# Patient Record
Sex: Male | Born: 1935 | Race: White | Hispanic: No | Smoking: Former smoker
Health system: Southern US, Community
[De-identification: ages and names within clinical notes are randomized; demographics above are authoritative.]

## PROBLEM LIST (undated history)

## (undated) DIAGNOSIS — M1990 Unspecified osteoarthritis, unspecified site: Secondary | ICD-10-CM

## (undated) DIAGNOSIS — E079 Disorder of thyroid, unspecified: Secondary | ICD-10-CM

## (undated) DIAGNOSIS — H3530 Unspecified macular degeneration: Secondary | ICD-10-CM

## (undated) DIAGNOSIS — I10 Essential (primary) hypertension: Secondary | ICD-10-CM

## (undated) DIAGNOSIS — J45909 Unspecified asthma, uncomplicated: Secondary | ICD-10-CM

## (undated) DIAGNOSIS — E7800 Pure hypercholesterolemia, unspecified: Secondary | ICD-10-CM

## (undated) DIAGNOSIS — H9190 Unspecified hearing loss, unspecified ear: Secondary | ICD-10-CM

## (undated) DIAGNOSIS — J302 Other seasonal allergic rhinitis: Secondary | ICD-10-CM

## (undated) HISTORY — DX: Unspecified osteoarthritis, unspecified site: M19.90

## (undated) HISTORY — DX: Unspecified asthma, uncomplicated: J45.909

## (undated) HISTORY — DX: Pure hypercholesterolemia, unspecified: E78.00

## (undated) HISTORY — DX: Unspecified hearing loss, unspecified ear: H91.90

## (undated) HISTORY — PX: PACEMAKER PLACEMENT: SHX43

## (undated) HISTORY — DX: Unspecified macular degeneration: H35.30

## (undated) HISTORY — DX: Disorder of thyroid, unspecified: E07.9

## (undated) HISTORY — DX: Other seasonal allergic rhinitis: J30.2

## (undated) HISTORY — DX: Essential (primary) hypertension: I10

---

## 2004-11-02 ENCOUNTER — Encounter: Payer: Self-pay | Admitting: Otolaryngology

## 2004-11-06 ENCOUNTER — Ambulatory Visit: Payer: Self-pay | Admitting: Otolaryngology

## 2005-03-04 ENCOUNTER — Emergency Department: Payer: Self-pay | Admitting: Internal Medicine

## 2005-03-04 ENCOUNTER — Other Ambulatory Visit: Payer: Self-pay

## 2006-06-13 ENCOUNTER — Ambulatory Visit: Payer: Self-pay | Admitting: Internal Medicine

## 2007-01-30 ENCOUNTER — Ambulatory Visit: Payer: Self-pay | Admitting: Otolaryngology

## 2008-10-22 ENCOUNTER — Ambulatory Visit: Payer: Self-pay | Admitting: Internal Medicine

## 2008-10-23 ENCOUNTER — Ambulatory Visit: Payer: Self-pay | Admitting: Internal Medicine

## 2009-03-19 ENCOUNTER — Inpatient Hospital Stay: Payer: Self-pay | Admitting: Internal Medicine

## 2009-07-07 ENCOUNTER — Emergency Department: Payer: Self-pay | Admitting: Emergency Medicine

## 2011-06-08 ENCOUNTER — Inpatient Hospital Stay: Payer: Self-pay | Admitting: Internal Medicine

## 2011-11-22 ENCOUNTER — Ambulatory Visit: Payer: Self-pay | Admitting: Internal Medicine

## 2012-02-25 ENCOUNTER — Ambulatory Visit: Payer: Self-pay | Admitting: Podiatry

## 2012-11-27 ENCOUNTER — Inpatient Hospital Stay: Payer: Self-pay | Admitting: Specialist

## 2012-11-27 LAB — URINALYSIS, COMPLETE
Blood: NEGATIVE
Glucose,UR: NEGATIVE mg/dL (ref 0–75)
Hyaline Cast: 1
Ketone: NEGATIVE
Protein: >=500
Squamous Epithelial: NONE SEEN
WBC UR: 2 /HPF (ref 0–5)

## 2012-11-27 LAB — COMPREHENSIVE METABOLIC PANEL
Albumin: 3.1 g/dL — ABNORMAL LOW (ref 3.4–5.0)
Alkaline Phosphatase: 130 U/L (ref 50–136)
Bilirubin,Total: 0.5 mg/dL (ref 0.2–1.0)
Calcium, Total: 8.3 mg/dL — ABNORMAL LOW (ref 8.5–10.1)
Creatinine: 1.8 mg/dL — ABNORMAL HIGH (ref 0.60–1.30)
EGFR (African American): 36 — ABNORMAL LOW
EGFR (Non-African Amer.): 31 — ABNORMAL LOW
Potassium: 3.5 mmol/L (ref 3.5–5.1)
SGPT (ALT): 15 U/L (ref 12–78)
Sodium: 143 mmol/L (ref 136–145)

## 2012-11-27 LAB — CBC WITH DIFFERENTIAL/PLATELET
Basophil %: 0.8 %
Eosinophil #: 0.1 10*3/uL (ref 0.0–0.7)
HGB: 13.4 g/dL (ref 13.0–18.0)
Lymphocyte #: 0.4 10*3/uL — ABNORMAL LOW (ref 1.0–3.6)
Lymphocyte %: 3.9 %
MCH: 29.5 pg (ref 26.0–34.0)
Monocyte %: 14.4 %
Neutrophil #: 8.9 10*3/uL — ABNORMAL HIGH (ref 1.4–6.5)
Platelet: 151 10*3/uL (ref 150–440)
WBC: 11.1 10*3/uL — ABNORMAL HIGH (ref 3.8–10.6)

## 2012-11-27 LAB — TROPONIN I
Troponin-I: 0.06 ng/mL — ABNORMAL HIGH
Troponin-I: 0.13 ng/mL — ABNORMAL HIGH

## 2012-11-27 LAB — CK-MB: CK-MB: 2.2 ng/mL (ref 0.5–3.6)

## 2012-11-29 LAB — BASIC METABOLIC PANEL
BUN: 40 mg/dL — ABNORMAL HIGH (ref 7–18)
Calcium, Total: 8.5 mg/dL (ref 8.5–10.1)
Co2: 23 mmol/L (ref 21–32)
Sodium: 140 mmol/L (ref 136–145)

## 2012-12-03 LAB — CULTURE, BLOOD (SINGLE)

## 2013-03-18 ENCOUNTER — Emergency Department: Payer: Self-pay | Admitting: Emergency Medicine

## 2013-03-18 LAB — BASIC METABOLIC PANEL
Anion Gap: 7 (ref 7–16)
BUN: 40 mg/dL — ABNORMAL HIGH (ref 7–18)
Calcium, Total: 8.8 mg/dL (ref 8.5–10.1)
Co2: 29 mmol/L (ref 21–32)
EGFR (African American): 31 — ABNORMAL LOW
EGFR (Non-African Amer.): 26 — ABNORMAL LOW
Potassium: 3.5 mmol/L (ref 3.5–5.1)
Sodium: 143 mmol/L (ref 136–145)

## 2013-03-18 LAB — CK TOTAL AND CKMB (NOT AT ARMC)
CK, Total: 52 U/L (ref 35–232)
CK-MB: 3.2 ng/mL (ref 0.5–3.6)

## 2013-03-18 LAB — CBC
HGB: 12.1 g/dL — ABNORMAL LOW (ref 13.0–18.0)
MCH: 27.9 pg (ref 26.0–34.0)
MCV: 89 fL (ref 80–100)
Platelet: 173 10*3/uL (ref 150–440)
RBC: 4.33 10*6/uL — ABNORMAL LOW (ref 4.40–5.90)
RDW: 15.6 % — ABNORMAL HIGH (ref 11.5–14.5)
WBC: 11.1 10*3/uL — ABNORMAL HIGH (ref 3.8–10.6)

## 2013-03-18 LAB — TROPONIN I: Troponin-I: 0.04 ng/mL

## 2013-04-04 ENCOUNTER — Encounter: Payer: Self-pay | Admitting: *Deleted

## 2013-04-09 ENCOUNTER — Encounter: Payer: Self-pay | Admitting: *Deleted

## 2013-04-24 ENCOUNTER — Encounter: Payer: Self-pay | Admitting: General Surgery

## 2013-04-24 ENCOUNTER — Ambulatory Visit (INDEPENDENT_AMBULATORY_CARE_PROVIDER_SITE_OTHER): Payer: Medicare Other | Admitting: General Surgery

## 2013-04-24 VITALS — BP 130/68 | Ht 69.0 in | Wt 172.0 lb

## 2013-04-24 DIAGNOSIS — I872 Venous insufficiency (chronic) (peripheral): Secondary | ICD-10-CM

## 2013-04-24 NOTE — Progress Notes (Signed)
Patient ID: Casey Barrett, male   DOB: 11/29/1912, 79 y.o.   MRN: [id number]  Chief Complaint  Patient presents with  . Other    leg swelling    HPI Casey Barrett is a 79 y.o. male.  Patient here today for evaluation of swelling in his legs. The right being worse that the left. He developed a large amount of ecchymosis in the right foot without any apparent injury and was seen in the ER about 3 weeks ago. The swelling has improved since then, and the ecchymosis has cleared. He has chronic edema in both legs. In ER Xray of the right foot showed no bony injuries.  HPI  Past Medical History  Diagnosis Date  . Hypertension   . Thyroid disease   . Hearing difficulty   . Macula lutea degeneration   . Asthma   . Seasonal allergies   . Arthritis   . High cholesterol     Past Surgical History  Procedure Laterality Date  . Pacemaker placement  2003, 2010    History reviewed. No pertinent family history.  Social History History  Substance Use Topics  . Smoking status: Former Games developer  . Smokeless tobacco: Not on file  . Alcohol Use: No    Allergies  Allergen Reactions  . Penicillins Itching  . Sulfa Antibiotics Itching  . Tetracyclines & Related Nausea And Vomiting    Current Outpatient Prescriptions  Medication Sig Dispense Refill  . aspirin 81 MG tablet Take 81 mg by mouth daily.      . budesonide-formoterol (SYMBICORT) 80-4.5 MCG/ACT inhaler Inhale 2 puffs into the lungs 2 (two) times daily.      . Calcium-Magnesium-Vitamin D (CALCIUM 1200+D3 PO) Take by mouth daily.      Marland Kitchen dicyclomine (BENTYL) 20 MG tablet Take 20 mg by mouth as needed.      . flunisolide (NASALIDE) 25 MCG/ACT (0.025%) SOLN Inhale 2 sprays into the lungs 2 (two) times daily.      Marland Kitchen levothyroxine (SYNTHROID, LEVOTHROID) 25 MCG tablet Take 25 mcg by mouth daily before breakfast.      . loratadine (CLARITIN) 10 MG tablet Take 10 mg by mouth daily.      . Multiple Vitamins-Minerals (EYE VITAMINS PO) Take by  mouth daily.      . simvastatin (ZOCOR) 10 MG tablet Take 10 mg by mouth at bedtime.       No current facility-administered medications for this visit.    Review of Systems Review of Systems  Constitutional: Negative.   Respiratory: Negative.   Cardiovascular: Positive for leg swelling. Negative for chest pain and palpitations.    Blood pressure 130/68, height 5\' 9"  (1.753 m), weight 172 lb (78.019 kg).  Physical Exam Physical Exam  Constitutional: He appears well-developed and well-nourished.  Patient gives a very good history and appears well oriented.   Cardiovascular: Normal rate and normal heart sounds.  An irregular rhythm present.  Pulses:      Dorsalis pedis pulses are 2+ on the right side, and 2+ on the left side.       Posterior tibial pulses are 0 on the right side, and 0 on the left side.  Edema in both legs noted. Mild stasis discoloration.  Feet are warm with brisk capillary refill. posterior tibial pulses are difficult to feel because of edema.  Pulmonary/Chest: Breath sounds normal.  Abdominal: Soft. There is no tenderness.  Lymphadenopathy:       Right: No inguinal adenopathy present.  Left: No inguinal adenopathy present.    Data Reviewed Xray report of right foot  Assessment    likely patient had a mild contusion in his right foot which seems to have resolved. He does have features of chronic venous insufficiency. He reports having used compression stockings and still has a pair at home but has not used them in some time.    Plan    Recommend use of his compression hose. Explained this to him. Will recheck in one month.        Casey Barrett G 04/24/2013, 8:38 PM

## 2013-04-24 NOTE — Patient Instructions (Addendum)
Wear compression stockings. Bring these with you to your next appointment.

## 2013-05-28 ENCOUNTER — Encounter: Payer: Self-pay | Admitting: General Surgery

## 2013-05-28 ENCOUNTER — Ambulatory Visit (INDEPENDENT_AMBULATORY_CARE_PROVIDER_SITE_OTHER): Payer: Medicare Other | Admitting: General Surgery

## 2013-05-28 VITALS — BP 120/74 | HR 76 | Resp 14 | Ht 70.0 in | Wt 172.0 lb

## 2013-05-28 DIAGNOSIS — I872 Venous insufficiency (chronic) (peripheral): Secondary | ICD-10-CM

## 2013-05-28 NOTE — Patient Instructions (Addendum)
Patient advised to continue using compression stockings daily. Patient to return in 6 months. Patient to contact our office with any questions or concerns.

## 2013-05-28 NOTE — Progress Notes (Signed)
Patient ID: Casey Barrett, male   DOB: [birth year], 79 y.o.   MRN: [id number]  Chief Complaint  Patient presents with  . Follow-up    1 month follow up venous insufficiency    HPI Casey Barrett is a 79 y.o. male who presents for a 1 month follow up of venous insufficiency. The patient states his legs are doing better since his last visit 1 month ago. He is using his compression hose which seems to have helped. His stockings seem to be fitting well. No skin breakdown noted.    HPI  Past Medical History  Diagnosis Date  . Hypertension   . Thyroid disease   . Hearing difficulty   . Macula lutea degeneration   . Asthma   . Seasonal allergies   . Arthritis   . High cholesterol     Past Surgical History  Procedure Laterality Date  . Pacemaker placement  2003, 2010    History reviewed. No pertinent family history.  Social History History  Substance Use Topics  . Smoking status: Former Games developer  . Smokeless tobacco: Not on file  . Alcohol Use: No    Allergies  Allergen Reactions  . Penicillins Itching  . Sulfa Antibiotics Itching  . Tetracyclines & Related Nausea And Vomiting    Current Outpatient Prescriptions  Medication Sig Dispense Refill  . aspirin 81 MG tablet Take 81 mg by mouth daily.      . budesonide-formoterol (SYMBICORT) 80-4.5 MCG/ACT inhaler Inhale 2 puffs into the lungs 2 (two) times daily.      . Calcium-Magnesium-Vitamin D (CALCIUM 1200+D3 PO) Take by mouth daily.      Marland Kitchen dicyclomine (BENTYL) 20 MG tablet Take 20 mg by mouth as needed.      . flunisolide (NASALIDE) 25 MCG/ACT (0.025%) SOLN Inhale 2 sprays into the lungs 2 (two) times daily.      Marland Kitchen levothyroxine (SYNTHROID, LEVOTHROID) 25 MCG tablet Take 25 mcg by mouth daily before breakfast.      . loratadine (CLARITIN) 10 MG tablet Take 10 mg by mouth daily.      . Multiple Vitamins-Minerals (EYE VITAMINS PO) Take by mouth daily.      . simvastatin (ZOCOR) 10 MG tablet Take 10 mg by mouth at bedtime.        No current facility-administered medications for this visit.    Review of Systems Review of Systems  Constitutional: Negative.   Respiratory: Negative.   Cardiovascular: Negative.     Blood pressure 120/74, pulse 76, resp. rate 14, height 5\' 10"  (1.778 m), weight 172 lb (78.019 kg).  Physical Exam Physical Exam  Cardiovascular:  Bilateral Edema present improved since last visit.   No skin breakdown noted. Mild skin color change in lower half.  Data Reviewed    Assessment    Chronic venous insufficiency, under control.      Plan    Advised pt to continue using the stockings.         Anjeanette Petzold G 05/29/2013, 6:47 AM

## 2013-05-29 ENCOUNTER — Encounter: Payer: Self-pay | Admitting: General Surgery

## 2013-07-05 ENCOUNTER — Ambulatory Visit: Payer: Self-pay | Admitting: Neurology

## 2013-07-22 ENCOUNTER — Ambulatory Visit: Payer: Self-pay | Admitting: Orthopedic Surgery

## 2013-07-22 ENCOUNTER — Inpatient Hospital Stay: Payer: Self-pay | Admitting: Orthopedic Surgery

## 2013-07-22 LAB — COMPREHENSIVE METABOLIC PANEL
Albumin: 3 g/dL — ABNORMAL LOW (ref 3.4–5.0)
Alkaline Phosphatase: 126 U/L — ABNORMAL HIGH
BUN: 48 mg/dL — ABNORMAL HIGH (ref 7–18)
Bilirubin,Total: 0.3 mg/dL (ref 0.2–1.0)
Calcium, Total: 8.7 mg/dL (ref 8.5–10.1)
Chloride: 109 mmol/L — ABNORMAL HIGH (ref 98–107)
Creatinine: 2.17 mg/dL — ABNORMAL HIGH (ref 0.60–1.30)
EGFR (African American): 28 — ABNORMAL LOW
EGFR (Non-African Amer.): 24 — ABNORMAL LOW
Glucose: 100 mg/dL — ABNORMAL HIGH (ref 65–99)
Osmolality: 296 (ref 275–301)
Potassium: 3.8 mmol/L (ref 3.5–5.1)
SGPT (ALT): 17 U/L (ref 12–78)
Sodium: 142 mmol/L (ref 136–145)
Total Protein: 6.8 g/dL (ref 6.4–8.2)

## 2013-07-22 LAB — CBC WITH DIFFERENTIAL/PLATELET
Basophil %: 0.7 %
Eosinophil #: 0.3 10*3/uL (ref 0.0–0.7)
Eosinophil %: 2.3 %
HCT: 37.9 % — ABNORMAL LOW (ref 40.0–52.0)
HGB: 12.4 g/dL — ABNORMAL LOW (ref 13.0–18.0)
Lymphocyte #: 0.9 10*3/uL — ABNORMAL LOW (ref 1.0–3.6)
Lymphocyte %: 7.5 %
MCH: 30.5 pg (ref 26.0–34.0)
MCV: 93 fL (ref 80–100)
Monocyte %: 15.1 %
Neutrophil #: 8.8 10*3/uL — ABNORMAL HIGH (ref 1.4–6.5)
Neutrophil %: 74.4 %
RDW: 16.5 % — ABNORMAL HIGH (ref 11.5–14.5)
WBC: 11.8 10*3/uL — ABNORMAL HIGH (ref 3.8–10.6)

## 2013-07-22 LAB — SYNOVIAL CELL COUNT + DIFF, W/ CRYSTALS
Basophil: 0 %
Crystals, Joint Fluid: NONE SEEN
Neutrophils: 94 %
Nucleated Cell Count: 45027 /mm3
Other Cells BF: 0 %
Other Mononuclear Cells: 5 %

## 2013-07-22 LAB — URINALYSIS, COMPLETE
Bacteria: NONE SEEN
Blood: NEGATIVE
Glucose,UR: NEGATIVE mg/dL (ref 0–75)
Ketone: NEGATIVE
Leukocyte Esterase: NEGATIVE
Ph: 6 (ref 4.5–8.0)
Protein: 100
RBC,UR: 2 /HPF (ref 0–5)
Specific Gravity: 1.012 (ref 1.003–1.030)
Squamous Epithelial: NONE SEEN

## 2013-07-22 LAB — SEDIMENTATION RATE: Erythrocyte Sed Rate: 37 mm/hr — ABNORMAL HIGH (ref 0–20)

## 2013-07-23 LAB — CBC WITH DIFFERENTIAL/PLATELET
Basophil %: 0.3 %
Eosinophil %: 0.3 %
HCT: 31.4 % — ABNORMAL LOW (ref 40.0–52.0)
HGB: 10.3 g/dL — ABNORMAL LOW (ref 13.0–18.0)
Lymphocyte %: 4.7 %
MCH: 30.3 pg (ref 26.0–34.0)
MCHC: 32.9 g/dL (ref 32.0–36.0)
MCV: 92 fL (ref 80–100)
Monocyte %: 23.7 %
Neutrophil %: 71 %
Platelet: 206 10*3/uL (ref 150–440)
RDW: 16.2 % — ABNORMAL HIGH (ref 11.5–14.5)
WBC: 13.7 10*3/uL — ABNORMAL HIGH (ref 3.8–10.6)

## 2013-07-23 LAB — BASIC METABOLIC PANEL
Calcium, Total: 8.3 mg/dL — ABNORMAL LOW (ref 8.5–10.1)
Chloride: 105 mmol/L (ref 98–107)
EGFR (African American): 27 — ABNORMAL LOW
EGFR (Non-African Amer.): 23 — ABNORMAL LOW
Osmolality: 288 (ref 275–301)
Potassium: 3.8 mmol/L (ref 3.5–5.1)
Sodium: 139 mmol/L (ref 136–145)

## 2013-07-24 LAB — CREATININE, SERUM: Creatinine: 2.51 mg/dL — ABNORMAL HIGH (ref 0.60–1.30)

## 2013-07-24 LAB — DIFFERENTIAL: Lymphocytes: 3 %

## 2013-07-25 ENCOUNTER — Other Ambulatory Visit: Payer: Self-pay | Admitting: *Deleted

## 2013-07-25 LAB — CBC WITH DIFFERENTIAL/PLATELET
Basophil %: 0.8 %
Eosinophil #: 0.3 10*3/uL (ref 0.0–0.7)
HCT: 31.4 % — ABNORMAL LOW (ref 40.0–52.0)
HGB: 10.3 g/dL — ABNORMAL LOW (ref 13.0–18.0)
MCH: 30.6 pg (ref 26.0–34.0)
MCHC: 32.9 g/dL (ref 32.0–36.0)
Monocyte %: 15.9 %
Neutrophil #: 8.4 10*3/uL — ABNORMAL HIGH (ref 1.4–6.5)
Neutrophil %: 72.8 %
Platelet: 218 10*3/uL (ref 150–440)
RBC: 3.38 10*6/uL — ABNORMAL LOW (ref 4.40–5.90)
RDW: 16.2 % — ABNORMAL HIGH (ref 11.5–14.5)
WBC: 11.6 10*3/uL — ABNORMAL HIGH (ref 3.8–10.6)

## 2013-07-25 LAB — CREATININE, SERUM: EGFR (African American): 26 — ABNORMAL LOW

## 2013-07-25 MED ORDER — HYDROCODONE-ACETAMINOPHEN 5-325 MG PO TABS: 1.0000 | ORAL_TABLET | ORAL | Status: AC | PRN

## 2013-07-26 ENCOUNTER — Other Ambulatory Visit: Payer: Self-pay | Admitting: Internal Medicine

## 2013-07-26 LAB — BASIC METABOLIC PANEL
Anion Gap: 8 (ref 7–16)
BUN: 46 mg/dL — ABNORMAL HIGH (ref 7–18)
Calcium, Total: 9.1 mg/dL (ref 8.5–10.1)
Chloride: 107 mmol/L (ref 98–107)
Creatinine: 1.98 mg/dL — ABNORMAL HIGH (ref 0.60–1.30)
Potassium: 4 mmol/L (ref 3.5–5.1)
Sodium: 142 mmol/L (ref 136–145)

## 2013-07-28 ENCOUNTER — Other Ambulatory Visit: Payer: Self-pay

## 2013-07-28 LAB — BASIC METABOLIC PANEL
Anion Gap: 6 — ABNORMAL LOW (ref 7–16)
Calcium, Total: 8.5 mg/dL (ref 8.5–10.1)
Co2: 29 mmol/L (ref 21–32)
Creatinine: 2.06 mg/dL — ABNORMAL HIGH (ref 0.60–1.30)
EGFR (Non-African Amer.): 26 — ABNORMAL LOW
Glucose: 80 mg/dL (ref 65–99)
Potassium: 3.5 mmol/L (ref 3.5–5.1)
Sodium: 144 mmol/L (ref 136–145)

## 2013-07-30 ENCOUNTER — Non-Acute Institutional Stay (SKILLED_NURSING_FACILITY): Payer: Medicare Other | Admitting: Internal Medicine

## 2013-07-30 ENCOUNTER — Ambulatory Visit: Payer: Self-pay | Admitting: Internal Medicine

## 2013-07-30 ENCOUNTER — Encounter: Payer: Self-pay | Admitting: Internal Medicine

## 2013-07-30 DIAGNOSIS — F0150 Vascular dementia without behavioral disturbance: Secondary | ICD-10-CM

## 2013-07-30 DIAGNOSIS — E039 Hypothyroidism, unspecified: Secondary | ICD-10-CM

## 2013-07-30 DIAGNOSIS — I10 Essential (primary) hypertension: Secondary | ICD-10-CM

## 2013-07-30 DIAGNOSIS — M009 Pyogenic arthritis, unspecified: Secondary | ICD-10-CM

## 2013-07-30 DIAGNOSIS — E785 Hyperlipidemia, unspecified: Secondary | ICD-10-CM

## 2013-07-30 NOTE — Progress Notes (Signed)
Patient ID: Casey Barrett, male   DOB: 06-Jun-1913, 79 y.o.   MRN: [id number]  [facility]    PCP: Corky Downs, MD  Code Status: full code  Allergies  Allergen Reactions  . Penicillins Itching  . Sulfa Antibiotics Itching  . Tetracyclines & Related Nausea And Vomiting    Chief Complaint: new admit from hospital  HPI:  79 y/o male patient with history of dementia is here for STR post hospital admission with septic right knee arthritis. He was at [facility] from 07/22/13- 07/25/13. He underwent arthroscopic irrigation and debridement of the right knee. He was then started on antibiotics and a PICC line was placed 07/24/13. ID was consulted in the meanwhile and he is on vancomycin and rocephin. He remains afebrile. He is working with therapy team. He can carry out a conversation but is pleasantly confused. He denies any complaints this visit.  Review of Systems  Constitutional: Negative for fever, chills, weight loss, malaise/fatigue and diaphoresis.  HENT: Negative for congestion, hearing loss and sore throat.   Eyes: Negative for blurred vision, double vision and discharge.  Respiratory: Negative for cough, sputum production, shortness of breath and wheezing.   Cardiovascular: Negative for chest pain, palpitations, orthopnea and leg swelling.  Gastrointestinal: Negative for heartburn, nausea, vomiting, abdominal pain, diarrhea and constipation.  Genitourinary: Negative for dysuria, urgency, frequency and flank pain.  Musculoskeletal: Negative for back pain, falls, joint pain and myalgias.  Skin: Negative for itching and rash.  Neurological: Positive for weakness. Negative for dizziness, tingling, focal weakness and headaches.  Psychiatric/Behavioral: Negative for depression.The patient is not nervous/anxious.     Past Medical History  Diagnosis Date  . Hypertension   . Thyroid disease   . Hearing difficulty   . Macula lutea degeneration   . Asthma   . Seasonal allergies    . Arthritis   . High cholesterol    Past Surgical History  Procedure Laterality Date  . Pacemaker placement  2003, 2010   Social History:   reports that he has quit smoking. He does not have any smokeless tobacco history on file. He reports that he does not drink alcohol or use illicit drugs.  History reviewed. No pertinent family history.  Medications: Patient's Medications  New Prescriptions   No medications on file  Previous Medications   ACETAMINOPHEN (TYLENOL) 500 MG TABLET    Take 500 mg by mouth every 4 (four) hours as needed.   ASPIRIN 81 MG TABLET    Take 81 mg by mouth daily.   BUDESONIDE-FORMOTEROL (SYMBICORT) 80-4.5 MCG/ACT INHALER    Inhale 2 puffs into the lungs 2 (two) times daily.   CALCIUM-MAGNESIUM-VITAMIN D (CALCIUM 1200+D3 PO)    Take by mouth daily.   CHLORTHALIDONE (HYGROTON) 25 MG TABLET    Take 25 mg by mouth daily.   DABIGATRAN (PRADAXA) 75 MG CAPS CAPSULE    Take 75 mg by mouth daily.   DEXTROSE 5 % SOLN 50 ML WITH CEFTRIAXONE 1 G SOLR 1 G    Inject 1 g into the vein daily.   DICYCLOMINE (BENTYL) 20 MG TABLET    Take 20 mg by mouth as needed.   DONEPEZIL (ARICEPT) 5 MG TABLET    Take 5 mg by mouth at bedtime.   FLUNISOLIDE (NASALIDE) 25 MCG/ACT (0.025%) SOLN    Inhale 2 sprays into the lungs 2 (two) times daily.   HYDRALAZINE (APRESOLINE) 25 MG TABLET    Take 25 mg by mouth 3 (three) times daily.  HYDROCODONE-ACETAMINOPHEN (NORCO/VICODIN) 5-325 MG PER TABLET    Take 1 tablet by mouth every 4 (four) hours as needed for moderate pain.   LEVOTHYROXINE (SYNTHROID, LEVOTHROID) 25 MCG TABLET    Take 25 mcg by mouth daily before breakfast.   LORATADINE (CLARITIN) 10 MG TABLET    Take 10 mg by mouth daily.   MULTIPLE VITAMINS-MINERALS (EYE VITAMINS PO)    Take by mouth daily.   SIMVASTATIN (ZOCOR) 10 MG TABLET    Take 10 mg by mouth at bedtime.   SODIUM CHLORIDE 0.9 % SOLN 250 ML WITH VANCOMYCIN 1000 MG SOLR 1,000 MG    Inject 1,000 mg into the vein every 36  (thirty-six) hours.  Modified Medications   No medications on file  Discontinued Medications   No medications on file     Physical Exam:  Filed Vitals:   07/30/13 1542  BP: 135/60  Pulse: 66  Temp: 97.5 F (36.4 C)  Resp: 18   General- elderly male in no acute distress Head- atraumatic, normocephalic Eyes- PERRLA, EOMI, no pallor, no icterus Neck- no lymphadenopathy, no thyromegaly, no jugular vein distension Chest- no chest wall deformities, no chest wall tenderness Cardiovascular- normal s1,s2, no murmurs/ rubs/ gallops Respiratory- bilateral clear to auscultation, no wheeze, no rhonchi, no crackles Abdomen- bowel sounds present, soft, non tender Musculoskeletal- able to move all 4 extremities, using a walker and a wheelcahir, has sutures in right knee skin- clean, no erythema, picc line site clean and dry, chronic skin changes in legs Neurological- no focal deficit Psychiatry- alert and oriented to person, place and time, normal mood and affect  Labs reviewed: 07/26/13 glu 105, bun 46, cr 1.98, na 142, k 4, ca 9.1 07/28/13 glu 80, bun 51, cr 2.06  Assessment/Plan  Septic arthritis- remains afebrile. To complete 4 weeks of vancomycin and rocephin. Monitor vancomycin level and dose accordingly. Pain under control. To continue working with PT and OT, fall precautions. Has follow up with [facility]. Next vancomycin dose 750 mg on 08/04/13 with last dose on 07/29/13. PICC line and suture site care. Continue norco prn for pain  Hypertension- bp well controlled. Continue chlorthalidone and hydralazineRenal failure- monitor renal function, adjust vancomycin dose, encourage hydration  Hyperlipidemia- will continue his zocor for now  Hypothyroidism- continue levothyroxine for now, monitor clinically  Dementia- likely from vascular problem. Continue donepezil for now  S/p pacemaker and continue pradaxa for anticoagulation  Family/ staff Communication: reviewed care plan  with patient and nursing staff   Goals of care: talked with Social work to look into patient's housing situation as he has dementia and lives alone. After completing rehabilitation, ALF might be a better home situation for him   Labs/tests ordered- cbc, bmp, vanc trough

## 2013-08-04 ENCOUNTER — Other Ambulatory Visit: Payer: Self-pay

## 2013-08-04 LAB — VANCOMYCIN, TROUGH: Vancomycin, Trough: 15 ug/mL (ref 10–20)

## 2013-08-04 LAB — CREATININE, SERUM
Creatinine: 1.94 mg/dL — ABNORMAL HIGH (ref 0.60–1.30)
EGFR (African American): 32 — ABNORMAL LOW
EGFR (Non-African Amer.): 28 — ABNORMAL LOW

## 2013-08-18 ENCOUNTER — Other Ambulatory Visit: Payer: Self-pay

## 2013-08-18 LAB — CREATININE, SERUM
Creatinine: 1.69 mg/dL — ABNORMAL HIGH (ref 0.60–1.30)
EGFR (African American): 38 — ABNORMAL LOW
EGFR (Non-African Amer.): 33 — ABNORMAL LOW

## 2013-08-18 LAB — BUN: BUN: 32 mg/dL — ABNORMAL HIGH (ref 7–18)

## 2013-08-18 LAB — VANCOMYCIN, TROUGH: Vancomycin, Trough: 18 ug/mL (ref 10–20)

## 2013-08-20 ENCOUNTER — Non-Acute Institutional Stay (SKILLED_NURSING_FACILITY): Payer: Medicare Other | Admitting: Adult Health

## 2013-08-20 DIAGNOSIS — J449 Chronic obstructive pulmonary disease, unspecified: Secondary | ICD-10-CM

## 2013-08-20 DIAGNOSIS — R609 Edema, unspecified: Secondary | ICD-10-CM

## 2013-08-20 DIAGNOSIS — J4489 Other specified chronic obstructive pulmonary disease: Secondary | ICD-10-CM

## 2013-08-20 DIAGNOSIS — I10 Essential (primary) hypertension: Secondary | ICD-10-CM

## 2013-08-26 ENCOUNTER — Encounter: Payer: Self-pay | Admitting: Adult Health

## 2013-08-26 NOTE — Progress Notes (Signed)
Patient ID: Casey Barrett, male   DOB: 07-11-1913, 79 y.o.   MRN: [id number]     [facility]  Allergies  Allergen Reactions  . Penicillins Itching  . Sulfa Antibiotics Itching  . Tetracyclines & Related Nausea And Vomiting     Chief Complaint  Patient presents with  . Acute Visit    concerns     HPI:  No problem-specific assessment & plan notes found for this encounter.   Past Medical History  Diagnosis Date  . Hypertension   . Thyroid disease   . Hearing difficulty   . Macula lutea degeneration   . Asthma   . Seasonal allergies   . Arthritis   . High cholesterol     Past Surgical History  Procedure Laterality Date  . Pacemaker placement  2003, 2010    VITAL SIGNS BP 198/90  Pulse 80  Ht 5\' 10"  (1.778 m)  Wt 170 lb (77.111 kg)  BMI 24.39 kg/m2   Patient's Medications  New Prescriptions   No medications on file  Previous Medications   ACETAMINOPHEN (TYLENOL) 500 MG TABLET    Take 500 mg by mouth every 4 (four) hours as needed.   ASPIRIN 81 MG TABLET    Take 81 mg by mouth daily.   BUDESONIDE-FORMOTEROL (SYMBICORT) 80-4.5 MCG/ACT INHALER    Inhale 2 puffs into the lungs 2 (two) times daily.   CALCIUM-MAGNESIUM-VITAMIN D (CALCIUM 1200+D3 PO)    Take 600 mg by mouth 2 (two) times daily.    CHLORTHALIDONE (HYGROTON) 25 MG TABLET    Take 25 mg by mouth daily.   CLOPIDOGREL (PLAVIX) 75 MG TABLET    Take 75 mg by mouth daily with breakfast.   DEXTROSE 5 % SOLN 50 ML WITH CEFTRIAXONE 1 G SOLR 1 G    Inject 1 g into the vein daily.   DICYCLOMINE (BENTYL) 20 MG TABLET    Take 20 mg by mouth daily.    DONEPEZIL (ARICEPT) 5 MG TABLET    Take 5 mg by mouth at bedtime.   FLUNISOLIDE (NASALIDE) 25 MCG/ACT (0.025%) SOLN    Inhale 2 sprays into the lungs 2 (two) times daily.   HYDRALAZINE (APRESOLINE) 25 MG TABLET    Take 25 mg by mouth 2 (two) times daily.    HYDROCODONE-ACETAMINOPHEN (NORCO/VICODIN) 5-325 MG PER TABLET    Take 1 tablet by mouth every 4 (four) hours  as needed for moderate pain.   LEVOTHYROXINE (SYNTHROID, LEVOTHROID) 25 MCG TABLET    Take 25 mcg by mouth daily before breakfast.   LORATADINE (CLARITIN) 10 MG TABLET    Take 10 mg by mouth daily as needed.    MULTIPLE VITAMINS-MINERALS (EYE VITAMINS PO)    Take by mouth daily.   SODIUM CHLORIDE 0.9 % SOLN 250 ML WITH VANCOMYCIN 1000 MG SOLR 1,000 MG    Inject 1,000 mg into the vein every 36 (thirty-six) hours.   TRAMADOL (ULTRAM) 50 MG TABLET    Take by mouth every 4 (four) hours as needed.  Modified Medications   No medications on file  Discontinued Medications   DABIGATRAN (PRADAXA) 75 MG CAPS CAPSULE    Take 75 mg by mouth daily.   SIMVASTATIN (ZOCOR) 10 MG TABLET    Take 10 mg by mouth at bedtime.    SIGNIFICANT DIAGNOSTIC EXAMS    LABS REVIEWED:   07-26-13: glucose 105; bun 46; creat 1.98; k+4.0;na++ 142 07-28-13: glucose 80; bun 51; creat 2.06; k+3.5; na++ 144 08-04-13: sed rate 62  08-10-13: bun 31; creat 1.8     Review of Systems  Constitutional: Negative for fever and malaise/fatigue.  Eyes: Negative for blurred vision.  Respiratory: Positive for shortness of breath and wheezing. Negative for cough.   Cardiovascular: Positive for leg swelling. Negative for chest pain and palpitations.  Gastrointestinal: Negative for heartburn and abdominal pain.  Musculoskeletal: Negative for joint pain and myalgias.  Neurological: Negative for headaches.  Psychiatric/Behavioral: Negative for depression. The patient is not nervous/anxious.      Physical Exam  Constitutional: He appears well-developed and well-nourished. No distress.  Neck: No JVD present.  Cardiovascular: Normal rate, regular rhythm and intact distal pulses.   Respiratory: Effort normal. No respiratory distress. He has wheezes.  GI: Soft. Bowel sounds are normal. He exhibits no distension. There is no tenderness.  Musculoskeletal: Normal range of motion. He exhibits edema.  Has 2-3+ lower extremity edema    Neurological: He is alert.  Skin: Skin is warm and dry. He is not diaphoretic.  Psychiatric: He has a normal mood and affect.    ASSESSMENT/ PLAN:  1. Hypertension: is worse; will continue apresoline 5 mg twice daily; will being clonidine 0.1 mg twice daily and will monitor his status   2. Edema: is worse: will being lasix 20 mg daily and will check bmp in one week; will check chest x-ray today and will repeat in one week   3. Copd: is worse: will continue symbicort 80/4.5 mcg 2 puffs twice daily will begin duoneb now and every 6 hours as needed for wheezing and will monitor his status.

## 2013-08-28 ENCOUNTER — Inpatient Hospital Stay: Payer: Self-pay | Admitting: Internal Medicine

## 2013-08-28 ENCOUNTER — Non-Acute Institutional Stay (SKILLED_NURSING_FACILITY): Payer: Medicare Other | Admitting: Adult Health

## 2013-08-28 ENCOUNTER — Encounter: Payer: Self-pay | Admitting: Adult Health

## 2013-08-28 DIAGNOSIS — J984 Other disorders of lung: Secondary | ICD-10-CM

## 2013-08-28 DIAGNOSIS — R0603 Acute respiratory distress: Secondary | ICD-10-CM | POA: Insufficient documentation

## 2013-08-28 LAB — COMPREHENSIVE METABOLIC PANEL
Albumin: 2.6 g/dL — ABNORMAL LOW (ref 3.4–5.0)
Alkaline Phosphatase: 206 U/L — ABNORMAL HIGH
Anion Gap: 8 (ref 7–16)
BUN: 71 mg/dL — ABNORMAL HIGH (ref 7–18)
Chloride: 109 mmol/L — ABNORMAL HIGH (ref 98–107)
Co2: 27 mmol/L (ref 21–32)
EGFR (African American): 20 — ABNORMAL LOW
Glucose: 107 mg/dL — ABNORMAL HIGH (ref 65–99)
Osmolality: 308 (ref 275–301)
Potassium: 3.5 mmol/L (ref 3.5–5.1)
SGPT (ALT): 41 U/L (ref 12–78)
Sodium: 144 mmol/L (ref 136–145)
Total Protein: 6 g/dL — ABNORMAL LOW (ref 6.4–8.2)

## 2013-08-28 LAB — CBC
HGB: 11.4 g/dL — ABNORMAL LOW (ref 13.0–18.0)
Platelet: 160 10*3/uL (ref 150–440)
RDW: 17 % — ABNORMAL HIGH (ref 11.5–14.5)

## 2013-08-28 LAB — CK-MB
CK-MB: 4.2 ng/mL — ABNORMAL HIGH (ref 0.5–3.6)
CK-MB: 3.5 ng/mL (ref 0.5–3.6)

## 2013-08-28 LAB — PRO B NATRIURETIC PEPTIDE: B-Type Natriuretic Peptide: 33209 pg/mL — ABNORMAL HIGH (ref 0–450)

## 2013-08-28 LAB — CK TOTAL AND CKMB (NOT AT ARMC): CK, Total: 68 U/L (ref 35–232)

## 2013-08-28 NOTE — Progress Notes (Signed)
Patient ID: Casey Barrett, male   DOB: 1913-05-26, 79 y.o.   MRN: [id number]     [facility]  Allergies  Allergen Reactions  . Penicillins Itching  . Sulfa Antibiotics Itching  . Tetracyclines & Related Nausea And Vomiting      Chief Complaint  Patient presents with  . Acute Visit    change in status     HPI:  He is being seen today for respiratory distress. He has wheezing throughout; the nursing staff has given him a breathing treatment. He is unable to fully participate in the hpi or ros at this time. He is having increased difficulty swallowing thin liquids. He will need to go to the ED in order to have the tip removed.   Past Medical History  Diagnosis Date  . Hypertension   . Thyroid disease   . Hearing difficulty   . Macula lutea degeneration   . Asthma   . Seasonal allergies   . Arthritis   . High cholesterol     Past Surgical History  Procedure Laterality Date  . Pacemaker placement  2003, 2010    VITAL SIGNS BP 144/76  Pulse 86  Temp(Src) 97.6 F (36.4 C)  Resp 28  Ht 5\' 10"  (1.778 m)  Wt 170 lb (77.111 kg)  BMI 24.39 kg/m2   Patient's Medications  New Prescriptions   No medications on file  Previous Medications   ACETAMINOPHEN (TYLENOL) 500 MG TABLET    Take 500 mg by mouth every 4 (four) hours as needed.   ASPIRIN 81 MG TABLET    Take 81 mg by mouth daily.   BUDESONIDE-FORMOTEROL (SYMBICORT) 80-4.5 MCG/ACT INHALER    Inhale 2 puffs into the lungs 2 (two) times daily.   CALCIUM-MAGNESIUM-VITAMIN D (CALCIUM 1200+D3 PO)    Take 600 mg by mouth 2 (two) times daily.    CEPHALEXIN (KEFLEX) 500 MG CAPSULE    Take 500 mg by mouth 3 (three) times daily.   CHLORTHALIDONE (HYGROTON) 25 MG TABLET    Take 25 mg by mouth daily.   CLOPIDOGREL (PLAVIX) 75 MG TABLET    Take 75 mg by mouth daily with breakfast.   DICYCLOMINE (BENTYL) 20 MG TABLET    Take 20 mg by mouth daily.    DONEPEZIL (ARICEPT) 5 MG TABLET    Take 5 mg by mouth at bedtime.   FLUNISOLIDE (NASALIDE) 25 MCG/ACT (0.025%) SOLN    Inhale 2 sprays into the lungs 2 (two) times daily.   HYDRALAZINE (APRESOLINE) 25 MG TABLET    Take 25 mg by mouth 2 (two) times daily.    HYDROCODONE-ACETAMINOPHEN (NORCO/VICODIN) 5-325 MG PER TABLET    Take 1 tablet by mouth every 4 (four) hours as needed for moderate pain.   LEVOTHYROXINE (SYNTHROID, LEVOTHROID) 25 MCG TABLET    Take 25 mcg by mouth daily before breakfast.   LORATADINE (CLARITIN) 10 MG TABLET    Take 10 mg by mouth daily as needed.    MULTIPLE VITAMINS-MINERALS (EYE VITAMINS PO)    Take by mouth daily.   TRAMADOL (ULTRAM) 50 MG TABLET    Take by mouth every 4 (four) hours as needed.  Modified Medications   No medications on file  Discontinued Medications   DEXTROSE 5 % SOLN 50 ML WITH CEFTRIAXONE 1 G SOLR 1 G    Inject 1 g into the vein daily.   SODIUM CHLORIDE 0.9 % SOLN 250 ML WITH VANCOMYCIN 1000 MG SOLR 1,000 MG    Inject 1,000  mg into the vein every 36 (thirty-six) hours.    SIGNIFICANT DIAGNOSTIC EXAMS   08-24-13: chest x-ray: 1.l improving chest with findings consistent with resolving congestive heart failure. Persistent changes of congestive heart failure are still present; but improved 2. The right pleural effusion obscures the right hemidiaphragm, but less prominent.   08-27-13: chest x-ray: PICC line is identified entering with the tip in the superior vena cava. Right sided consolidation with pleural fluid. Congestive changes with right sides consolidation and pleural fluid.     LABS REVIEWED:   07-26-13: glucose 105; bun 46; creat 1.98; k+4.0;na++ 142 07-28-13: glucose 80; bun 51; creat 2.06; k+3.5; na++ 144 08-04-13: sed rate 62 08-10-13: bun 31; creat 1.8  08-22-13: bun 41; creat 2.0  08-27-13: glucose 124; bun 61; creat 2.8; k+ 3.5; na++ 149     Review of Systems  Unable to perform ROS  Physical Exam  Constitutional: He appears well-developed and well-nourished. He appears distressed.  Neck:  Neck supple. No JVD present.  Cardiovascular: Normal rate, regular rhythm and intact distal pulses.   Respiratory: He has wheezes.  Has respiratory distress present   GI: Soft. Bowel sounds are normal. He exhibits no distension.  Musculoskeletal: Normal range of motion.  Skin: He is diaphoretic.  Is diaphoretic      ASSESSMENT/ PLAN:  1. Acute respiratory distress due to picc line tip; will send to ED for removal of tip and further evaluation and treatment.   Time spent with patient 40 minutes.

## 2013-08-29 LAB — MAGNESIUM: Magnesium: 1.7 mg/dL — ABNORMAL LOW

## 2013-08-29 LAB — CBC WITH DIFFERENTIAL/PLATELET
Basophil #: 0.1 10*3/uL (ref 0.0–0.1)
Basophil %: 0.7 %
Eosinophil #: 0.9 10*3/uL — ABNORMAL HIGH (ref 0.0–0.7)
Eosinophil %: 6.8 %
Lymphocyte #: 0.3 10*3/uL — ABNORMAL LOW (ref 1.0–3.6)
Lymphocyte %: 2 %
MCH: 28.8 pg (ref 26.0–34.0)
MCHC: 32 g/dL (ref 32.0–36.0)
MCV: 90 fL (ref 80–100)
Monocyte #: 1.2 x10 3/mm — ABNORMAL HIGH (ref 0.2–1.0)
Monocyte %: 9.6 %
Neutrophil #: 10.2 10*3/uL — ABNORMAL HIGH (ref 1.4–6.5)
Neutrophil %: 80.9 %
Platelet: 151 10*3/uL (ref 150–440)
RBC: 3.76 10*6/uL — ABNORMAL LOW (ref 4.40–5.90)
RDW: 16.8 % — ABNORMAL HIGH (ref 11.5–14.5)
WBC: 12.7 10*3/uL — ABNORMAL HIGH (ref 3.8–10.6)

## 2013-08-29 LAB — URINALYSIS, COMPLETE
Bacteria: NONE SEEN
Bilirubin,UR: NEGATIVE
Glucose,UR: NEGATIVE mg/dL (ref 0–75)
Ketone: NEGATIVE
Nitrite: NEGATIVE
Ph: 5 (ref 4.5–8.0)
Protein: 30
Squamous Epithelial: NONE SEEN
WBC UR: <1 /HPF (ref 0–5)

## 2013-08-29 LAB — COMPREHENSIVE METABOLIC PANEL
BUN: 72 mg/dL — ABNORMAL HIGH (ref 7–18)
Bilirubin,Total: 0.3 mg/dL (ref 0.2–1.0)
Calcium, Total: 8 mg/dL — ABNORMAL LOW (ref 8.5–10.1)
Chloride: 104 mmol/L (ref 98–107)
Co2: 37 mmol/L — ABNORMAL HIGH (ref 21–32)
EGFR (African American): 19 — ABNORMAL LOW
Glucose: 114 mg/dL — ABNORMAL HIGH (ref 65–99)
Osmolality: 311 (ref 275–301)
Potassium: 3 mmol/L — ABNORMAL LOW (ref 3.5–5.1)
SGOT(AST): 25 U/L (ref 15–37)

## 2013-08-29 LAB — CK-MB: CK-MB: 3.2 ng/mL (ref 0.5–3.6)

## 2013-08-29 LAB — TSH: Thyroid Stimulating Horm: 2.06 u[IU]/mL

## 2013-08-29 LAB — TROPONIN I: Troponin-I: 0.29 ng/mL — ABNORMAL HIGH

## 2013-08-29 LAB — PROTIME-INR: INR: 1.3

## 2013-08-30 ENCOUNTER — Ambulatory Visit: Payer: Self-pay | Admitting: Internal Medicine

## 2013-08-30 LAB — CBC WITH DIFFERENTIAL/PLATELET
Basophil #: 0.1 10*3/uL (ref 0.0–0.1)
Basophil %: 0.9 %
EOS PCT: 7.5 %
Eosinophil #: 0.8 10*3/uL — ABNORMAL HIGH (ref 0.0–0.7)
HCT: 30.6 % — ABNORMAL LOW (ref 40.0–52.0)
HGB: 10.1 g/dL — ABNORMAL LOW (ref 13.0–18.0)
Lymphocyte #: 0.5 10*3/uL — ABNORMAL LOW (ref 1.0–3.6)
Lymphocyte %: 4.9 %
MCH: 29.6 pg (ref 26.0–34.0)
MCHC: 32.9 g/dL (ref 32.0–36.0)
MCV: 90 fL (ref 80–100)
MONOS PCT: 10.7 %
Monocyte #: 1.1 x10 3/mm — ABNORMAL HIGH (ref 0.2–1.0)
NEUTROS PCT: 76 %
Neutrophil #: 7.9 10*3/uL — ABNORMAL HIGH (ref 1.4–6.5)
Platelet: 126 10*3/uL — ABNORMAL LOW (ref 150–440)
RBC: 3.41 10*6/uL — ABNORMAL LOW (ref 4.40–5.90)
RDW: 16.6 % — AB (ref 11.5–14.5)
WBC: 10.4 10*3/uL (ref 3.8–10.6)

## 2013-08-30 LAB — BASIC METABOLIC PANEL
Anion Gap: 4 — ABNORMAL LOW (ref 7–16)
BUN: 86 mg/dL — ABNORMAL HIGH (ref 7–18)
CALCIUM: 8.3 mg/dL — AB (ref 8.5–10.1)
CHLORIDE: 102 mmol/L (ref 98–107)
CO2: 36 mmol/L — AB (ref 21–32)
Creatinine: 3.48 mg/dL — ABNORMAL HIGH (ref 0.60–1.30)
EGFR (African American): 16 — ABNORMAL LOW
EGFR (Non-African Amer.): 14 — ABNORMAL LOW
Glucose: 111 mg/dL — ABNORMAL HIGH (ref 65–99)
OSMOLALITY: 310 (ref 275–301)
Potassium: 3.4 mmol/L — ABNORMAL LOW (ref 3.5–5.1)
Sodium: 142 mmol/L (ref 136–145)

## 2013-08-30 LAB — MAGNESIUM: Magnesium: 2 mg/dL

## 2013-08-31 LAB — BASIC METABOLIC PANEL
ANION GAP: 6 — AB (ref 7–16)
BUN: 87 mg/dL — AB (ref 7–18)
CHLORIDE: 103 mmol/L (ref 98–107)
Calcium, Total: 8.4 mg/dL — ABNORMAL LOW (ref 8.5–10.1)
Co2: 35 mmol/L — ABNORMAL HIGH (ref 21–32)
Creatinine: 3.53 mg/dL — ABNORMAL HIGH (ref 0.60–1.30)
EGFR (African American): 16 — ABNORMAL LOW
EGFR (Non-African Amer.): 14 — ABNORMAL LOW
GLUCOSE: 89 mg/dL (ref 65–99)
Osmolality: 313 (ref 275–301)
Potassium: 3.5 mmol/L (ref 3.5–5.1)
Sodium: 144 mmol/L (ref 136–145)

## 2013-08-31 LAB — MAGNESIUM: Magnesium: 2.3 mg/dL

## 2013-09-02 LAB — BASIC METABOLIC PANEL
Anion Gap: 3 — ABNORMAL LOW (ref 7–16)
BUN: 99 mg/dL — ABNORMAL HIGH (ref 7–18)
Calcium, Total: 8.6 mg/dL (ref 8.5–10.1)
Chloride: 105 mmol/L (ref 98–107)
Co2: 35 mmol/L — ABNORMAL HIGH (ref 21–32)
Creatinine: 3.47 mg/dL — ABNORMAL HIGH (ref 0.60–1.30)
EGFR (African American): 16 — ABNORMAL LOW
EGFR (Non-African Amer.): 14 — ABNORMAL LOW
GLUCOSE: 172 mg/dL — AB (ref 65–99)
Osmolality: 320 (ref 275–301)
POTASSIUM: 3.5 mmol/L (ref 3.5–5.1)
Sodium: 143 mmol/L (ref 136–145)

## 2013-09-02 LAB — CULTURE, BLOOD (SINGLE)

## 2013-09-03 LAB — BASIC METABOLIC PANEL
ANION GAP: 3 — AB (ref 7–16)
BUN: 102 mg/dL — AB (ref 7–18)
CHLORIDE: 104 mmol/L (ref 98–107)
Calcium, Total: 9.2 mg/dL (ref 8.5–10.1)
Co2: 36 mmol/L — ABNORMAL HIGH (ref 21–32)
Creatinine: 3.25 mg/dL — ABNORMAL HIGH (ref 0.60–1.30)
GFR CALC AF AMER: 17 — AB
GFR CALC NON AF AMER: 15 — AB
GLUCOSE: 148 mg/dL — AB (ref 65–99)
OSMOLALITY: 320 (ref 275–301)
Potassium: 3.6 mmol/L (ref 3.5–5.1)
Sodium: 143 mmol/L (ref 136–145)

## 2013-09-04 LAB — BASIC METABOLIC PANEL
Anion Gap: 4 — ABNORMAL LOW (ref 7–16)
BUN: 105 mg/dL — ABNORMAL HIGH (ref 7–18)
CO2: 35 mmol/L — AB (ref 21–32)
Calcium, Total: 9.5 mg/dL (ref 8.5–10.1)
Chloride: 106 mmol/L (ref 98–107)
Creatinine: 2.83 mg/dL — ABNORMAL HIGH (ref 0.60–1.30)
EGFR (Non-African Amer.): 18 — ABNORMAL LOW
GFR CALC AF AMER: 21 — AB
GLUCOSE: 118 mg/dL — AB (ref 65–99)
Osmolality: 323 (ref 275–301)
POTASSIUM: 3.9 mmol/L (ref 3.5–5.1)
Sodium: 145 mmol/L (ref 136–145)

## 2013-09-05 LAB — BASIC METABOLIC PANEL
ANION GAP: 4 — AB (ref 7–16)
BUN: 111 mg/dL — ABNORMAL HIGH (ref 7–18)
CHLORIDE: 111 mmol/L — AB (ref 98–107)
Calcium, Total: 9.3 mg/dL (ref 8.5–10.1)
Co2: 36 mmol/L — ABNORMAL HIGH (ref 21–32)
Creatinine: 2.74 mg/dL — ABNORMAL HIGH (ref 0.60–1.30)
GFR CALC AF AMER: 21 — AB
GFR CALC NON AF AMER: 18 — AB
Glucose: 124 mg/dL — ABNORMAL HIGH (ref 65–99)
Osmolality: 336 (ref 275–301)
Potassium: 4.3 mmol/L (ref 3.5–5.1)
Sodium: 151 mmol/L — ABNORMAL HIGH (ref 136–145)

## 2013-09-05 LAB — WBC: WBC: 17.5 10*3/uL — ABNORMAL HIGH (ref 3.8–10.6)

## 2013-09-30 ENCOUNTER — Ambulatory Visit: Payer: Self-pay | Admitting: Internal Medicine

## 2013-09-30 DEATH — deceased

## 2014-12-20 NOTE — Consult Note (Signed)
PATIENT NAME:  Casey Barrett, Casey Barrett MR#:  [id number] DATE OF BIRTH:  November 23, 1912  DATE OF CONSULTATION:  07/25/2013  REFERRING PHYSICIAN:  Dr. Dawayne Patricia.  CONSULTING PHYSICIAN:  Cheral Marker. Ola Spurr, MD  REASON FOR CONSULTATION: Septic arthritis.   This is a very pleasant 81 year old gentleman, previously quite healthy, who had sudden onset of right-sided knee pain over 3 to 4 days. He was seen in the Emergency Room where he had a tap done that showed 50,000 white cells, predominantly PMNs. Culture from this was negative and crystal analysis was negative. He was taken to the operating room on November 23rd for a washout. Since that time, he has been on vancomycin and ceftriaxone. Clinically, he has slowly improved but is still having knee pain and very limited range of motion. He has been afebrile, but his white count has remained elevated and his ESR has elevated slightly since admission. We are consulted for further antibiotic management.   PAST MEDICAL HISTORY: 1.  Osteoarthritis.  2.  Sick sinus syndrome status post pacemaker.  3.  COPD.  4.  Hypothyroidism.  5.  Hyperlipidemia.  6.  Hypertension.  7.  Senile dementia, mild.  8.  Stage III chronic kidney disease.   PAST SURGICAL HISTORY: None.   ALLERGIES: The patient is listed as being allergic to PENICILLIN AND SULFA, WHICH CAUSE RASH, AND TETRACYCLINE WHICH HE SAYS CAUSES GI UPSET.   SOCIAL HISTORY: The patient is currently residing at [facility] but is going to be discharged to a skilled nursing facility for IV antibiotics.   FAMILY HISTORY: Noncontributory.   REVIEW OF SYSTEMS: Eleven systems reviewed and negative except as per HPI.   CURRENT ANTIBIOTICS SINCE ADMISSION: Include vancomycin and ceftriaxone 1 gram every 24 hours.   PHYSICAL EXAMINATION: VITAL SIGNS: T-max 98.9, pulse 76, blood pressure 162/70, respirations 18, sat 95% on room air.  GENERAL: He is pleasant, interactive, sitting in bed. He is quite  knowledgeable and interactive.  HEENT: His pupils equal, round and reactive to light and accommodation. Extraocular movements are intact. Sclerae are anicteric. His oropharynx is clear.  NECK: Supple.  HEART: Regular but distant heart sounds.  LUNGS: Clear to auscultation bilaterally.  ABDOMEN: Soft, nontender, nondistended.  EXTREMITIES: He has a PICC line in his right upper extremity. His right knee is swollen and has postoperative bandages on it. There was  limited range of motion and is quite warm. It is mildly tender to palpation.   LABORATORY, DIAGNOSTIC AND RADIOLOGICAL DATA: Blood cultures of his knee done on November 23rd were negative. It is unclear to me if he received any antibiotics prior to that. Synovial fluid showed 45,000 white cells, 94% PMNs, 1% lymphocytes. White count on admission was 11.8, currently 11.6. Sed rate on admission was 37, up to 5 currently. CRP done November 25th was elevated to 232. Urinalysis was normal. Renal function shows a creatinine of 2.31. Doppler of the right lower extremity showed negative for DVT. There was a 6 cm Baker cyst. Chest x-ray showed low lung volumes, but otherwise normal. Right knee x-ray on November 23rd, moderate to large suprapatellar effusion with potential gas, and this effusion concerning for potential septic arthritis. No acute bony changes identified.   IMPRESSION: A 81 year old gentleman, previously quite healthy,  who has septic arthritis of his right knee. He had no prodromal syndrome of infection with known recent pneumonia, gastrointestinal illness or urinary tract infections.  He has not had any recent dental work. It is unclear the source of  this, but it is likely hematogenous spread. Most likely pathogens would be staph or strep, although other including gram-negatives are also possible. His culture has been negative, unfortunately. He still has some swelling and pain at the site.   RECOMMENDATIONS:   1.  At this point, I would  recommend continuing vancomycin with a goal trough of 15 to 20 for 2 weeks from the date of surgery.  I would also increase his ceftriaxone to 2 grams once a day. This would provide adequate empiric coverage.  2.  I can see him next week and will follow his ESR and CRP. We could likely switch him after 2 weeks of IV antibiotics to suppressive therapy or to finish another 2 weeks with ciprofloxacin and doxycycline. HE IS LISTED AS BEING ALLERGIC TO TETRACYCLINE, BUT HE SAYS THIS IS MORE OF A GI INTOLERANCE. HE IS ALSO ALLERGIC TO BACTRIM.   Thank you for the consult. I will be glad to follow with you.   ____________________________ Cheral Marker. Ola Spurr, MD dpf:dmm D: 07/25/2013 12:51:13 ET T: 07/25/2013 13:19:01 ET JOB#: [id number]  cc: Cheral Marker. Ola Spurr, MD, <Dictator> Sabeen Piechocki Ola Spurr MD ELECTRONICALLY SIGNED 07/31/2013 13:00

## 2014-12-20 NOTE — H&P (Signed)
   Subjective/Chief Complaint Right knee pain   History of Present Illness 4 day h/o worsening right knee pain and inability to bend knee or put weight on it. He is also complaining of warmth about the knee and subjective fevers   Past History See below   Past Med/Surgical Hx:  Hypothyroidism:   Asthma:   Hypercholesterolemia:   Hypertension:   Pacemaker:   ALLERGIES:  Other -Explain in Comment: Other  Penicillin: Unknown  Tetracycline: Unknown  Sulfa drugs: Unknown  Family and Social History:  Family History Non-Contributory   Social History negative tobacco, negative ETOH   Place of Living Home   Review of Systems:  Fever/Chills Yes   Cough No   Sputum No   SOB/DOE No   Chest Pain No   Physical Exam:  GEN no acute distress   RESP normal resp effort   CARD regular rate   EXTR right knee effusion, warmn to touch, motion limited to 10-50 degrees with marked discomfort   Additional Comments Right knee warm with effusion and decreased ROM and inablility to bear weight. Knee aspirate in ED came back with ~50,000 WBC with 94% PMNs    Assessment/Admission Diagnosis 81 y/o male with presumed right knee septic arthritis   Plan Repeat aspirate in ED confirmed purulent appearing fluid. The joint was then flushed with 30 ml Saline. PAtient will be admitted for arthroscopic I&D in the OR today   Electronic Signatures: Danelle EarthlyEckel, Tobin T (MD)  (Signed [phone]-Nov-14 07:48)  Authored: CHIEF COMPLAINT and HISTORY, PAST MEDICAL/SURGIAL HISTORY, ALLERGIES, FAMILY AND SOCIAL HISTORY, REVIEW OF SYSTEMS, PHYSICAL EXAM, ASSESSMENT AND PLAN   Last Updated: 23-Nov-14 07:48 by Danelle EarthlyEckel, Tobin T (MD)

## 2014-12-20 NOTE — Discharge Summary (Signed)
PATIENT NAME:  Casey Barrett, DAURIA MR#:  [id number] DATE OF BIRTH:  Oct 20, 1912  DATE OF ADMISSION:  11/27/2012 DATE OF DISCHARGE: 11/30/2012   For a detailed note, please see the history and physical done on admission.   DISCHARGE DIAGNOSES:  1.  Status post fall, likely a mechanical fall due to deconditioning. 2.  Elevated troponin likely in setting of chronic kidney disease and poor renal clearance. No evidence of acute coronary syndrome. 3.  Chronic obstructive pulmonary disease with acute bronchitis. 4.  Chronic kidney disease, stage III.  5.  Hypothyroidism. 6.  Hypertension.  7.  Hyperlipidemia.   DIET: The patient is being discharged on a low sodium, low fat diet.   ACTIVITY: As tolerated.   FOLLOWUP: With Dr. Juel Burrow in the next 2 to 4 weeks.  DISCHARGE MEDICATIONS: Aspirin 81 mg daily, Synthroid 25 mcg daily, PreserVision 1 tab daily, simvastatin 10 mg daily, calcium with vitamin D 2 tabs daily, Symbicort 160/4.5, 2 puffs b.i.d. DuoNeb q.i.d. as needed, Levaquin 5 mg daily x 5 days, prednisone taper starting at 60 mg down to 10 mg over the next 6 days and Norvasc 10 mg daily.   PERTINENT STUDIES DONE DURING THE HOSPITAL COURSE: As follows: A CT of the head done on admission without contrast showing no evidence of acute intracranial abnormalities. A chest x-ray done on admission showing COPD and cardiomegaly with a pacemaker present.   HOSPITAL COURSE: This is a 81 year old male with medical problems as mentioned above, who presented to the hospital with a presyncopal episode and fall.    1.  Presyncope/fall. The most likely cause of the patient's symptoms was probably a mechanical fall. The patient was trying to get up from the bathroom holding a rail and slipped and fell, although he did not lose consciousness. He was brought to the hospital and noted to have a mildly elevated troponin. He was observed overnight on telemetry, had three sets of cardiac markers checked, which slightly  trended up, but he had no arrhythmias. No further episodes of presyncope or fall. He was evaluated by physical therapy and thought he would benefit from short-term rehab. Therefore, presently he is being discharged there.  2.  Elevated troponin. The patient's troponin went up as high as 0.13. He never really had any chest pain. No acute EKG changes. Most likely this is elevated troponin in the setting of poor renal clearance and unlikely any evidence of acute coronary syndrome. The patient will continue his aspirin and statin as stated.  3.  Chronic kidney disease, stage III. The patient has CKD, stage III. Baseline creatinine is anywhere from 2.4 down to 1.8. His creatinine is currently at baseline. The patient was on hydrochlorothiazide and lisinopril, although that has been discontinued now and he will be discharged on Norvasc for blood pressure presently.  4.  Acute bronchitis/COPD. The patient did have some wheezing and some productive sputum while coming in the hospital. He was started on a prednisone taper and a Z-Pak. His wheezing has somewhat improved, but he continues to have some minimal wheezing or oral breathing, although he is not hypoxic and not short of breath. Presently I am discharging him on oral Levaquin for 5e days along with a prednisone taper along with Symbicort and some p.r.n. DuoNeb as stated.  5.  Hypothyroidism. The patient was maintained on his Synthroid. He will resume that.  6.  Hyperlipidemia. The patient was maintained on simvastatin. He will resume that upon discharge too.   CODE STATUS:  The patient is a full code.   He is being discharged to a skilled nursing facility. The family is agreeable with this plan.   TIME SPENT: 40 minutes.  ____________________________ Rolly PancakeVivek J. Cherlynn KaiserSainani, MD vjs:aw D: 11/30/2012 11:25:32 ET T: 11/30/2012 11:58:32 ET JOB#: [id number]  cc: Rolly PancakeVivek J. Cherlynn KaiserSainani, MD, <Dictator> Corky DownsJaved Masoud, MD Houston SirenVIVEK J SAINANI MD ELECTRONICALLY SIGNED 12/05/2012  13:37

## 2014-12-20 NOTE — Consult Note (Signed)
Brief Consult Note: Comments: Sp[facility] Infectious Disease [facility] Dr. Judyann Munsonynthia Snider with [facility] [place].  Based on current information available, she recommends 2-4 weeks of IV Vanc and IV ceftriaxone as currently dosed.  Vanc trough, BPM, and CBC to be drawn weekly. Vanc trough goal 15-20.   Will change based on final culture and senstivities.  Electronic Signatures: Murlean Harkamasunder, Keiandre Cygan (MD)  (Signed [phone]-Nov-14 12:52)  Authored: Brief Consult Note   Last Updated: 24-Nov-14 12:52 by Murlean Harkamasunder, Krithi Bray (MD)

## 2014-12-20 NOTE — Consult Note (Signed)
PATIENT NAME:  Casey Barrett, Casey Barrett MR#:  [id number] DATE OF BIRTH:  [birth year]  DATE OF CONSULTATION:  07/22/2013  REFERRING PHYSICIAN: Dr. York CeriseForbach  CONSULTING PHYSICIAN:  Duane LopeJeffrey D. Judithann SheenSparks, MD  FAMILY PHYSICIAN: Dr. Juel BurrowMasoud.   REASON FOR ADMISSION: Preop medical clearance prior to a knee surgery.   HISTORY OF PRESENT ILLNESS: The patient is a 81 year old male followed by Dr. Juel BurrowMasoud with a history of sick sinus syndrome, status post pacemaker implant, stage III chronic kidney disease, COPD/asthma and senile dementia. He presents to the Emergency Room with right nee pain. In the Emergency Room, the patient was noted to have a low-grade temperature and elevated white count. He was found to have a septic right knee and is admitted by orthopedics for further evaluation and treatment. The patient is without complaints except for knee pain.   PAST MEDICAL HISTORY:  1.  Sick sinus syndrome, status post pacemaker implant.  2.  COPD/asthma.  3.  Hypothyroidism.  4.  Hyperlipidemia.  5.  Benign hypertension.  6.  Senile dementia.  6.  Stage III chronic kidney disease.   MEDICATIONS:  1.  Aspirin 81 mg Barrett.o. daily.  2.  Synthroid 25 mcg Barrett.o. daily.  3.  Aricept 5 mg Barrett.o. daily.  4.  Chlorthalidone 25 mg Barrett.o. daily.  5.  Zocor 10 mg Barrett.o. daily.  6.  Symbicort 2 puffs b.i.d.   ALLERGIES: PENICILLIN, TETRACYCLINE AND SULFA.   SOCIAL HISTORY: Negative for alcohol or tobacco abuse.   FAMILY HISTORY: Noncontributory.   REVIEW OF SYSTEMS: CONSTITUTIONAL: No fevers. No change in weight. EYES: No blurred vision. ENT: No tinnitus or hearing loss. No nasal discharge or bleeding. RESPIRATORY: No cough or wheezing. Denies hemoptysis. CARDIOVASCULAR: No chest pain or orthopnea. No palpitations. GASTROINTESTINAL: No nausea, vomiting, or diarrhea. No change in bowel habits. No abdominal pain. GENITOURINARY: No dysuria or hematuria. No incontinence. ENDOCRINE: No polyuria or polydipsia. No heat or cold  intolerance.  HEMATOLOGIC: The patient denies anemia, easy bruising or bleeding. LYMPHATIC: No swollen glands. MUSCULOSKELETAL: The patient denies pain in his neck, back, shoulders or hips. Does have right knee pain. No gout. NEUROLOGIC: No numbness or migraines. Denies stroke or seizures. PSYCHIATRIC: The patient denies anxiety, insomnia or depression.   PHYSICAL EXAMINATION:  GENERAL: The patient is elderly in no acute distress.  VITAL SIGNS: Remarkable for a blood pressure of 141/67, heart rate of 68, respiratory rate of 18. Temperature 99.7.  HEENT: Normocephalic, atraumatic. Pupils equally round and reactive to light and accommodation. Extraocular movements are intact. Sclerae are anicteric. Conjunctivae are clear. Oropharynx is clear.  NECK: Supple without JVD. No adenopathy or thyromegaly is noted.  LUNGS: Clear to auscultation and percussion without wheezes, rales or rhonchi. No dullness.  Respiratory effort is normal.  CARDIAC: Regular rate and rhythm with normal S1 and S2. No significant rubs or gallops noted. There is a 1/6 systolic murmur present. Chest wall is nontender.  ABDOMEN: Soft, nontender, with normoactive bowel sounds. No organomegaly or masses were appreciated. No hernias or bruits were noted.  EXTREMITIES: Without clubbing, cyanosis or edema. Pulses were 2+ bilaterally.  SKIN: Warm and dry without rash or lesions.  NEUROLOGIC: Cranial nerves II through XII grossly intact. Deep tendon reflexes were symmetric. Motor and sensory examination is nonfocal.  PSYCHIATRIC: Revealed a patient who was alert and oriented to person, place, and time. He was cooperative and used good judgment.   LABORATORY DATA: White count was 11.8 with a hemoglobin of 12.4. Glucose 100 with a  BUN of 48, creatinine of 2.17, with a GFR of 24. EKG revealed paced rhythm.   ASSESSMENT:  1.  Septic arthrosis requiring surgical intervention.  2.  Sick sinus syndrome, status post pacemaker implant.  3.  Stage  III chronic kidney disease.  4.  Senile dementia.  5.  Hypothyroidism.  6.  Chronic obstructive pulmonary disease/asthma.  7.  Benign hypertension.   PLAN: The patient is medically stable and cleared for orthopedic surgery. He is high risk because of his age and comorbidities. We will add empiric oral nitrates at this time and continue aspirin therapy postop. We will follow his renal function closely. Routine labs in the morning. DuoNeb and SVNs as needed for shortness of breath. Thank you for the consultation. Will continue to follow this patient with you while in the hospital. Please call if questions arise.  ____________________________ Duane Lope. Judithann Sheen, MD jds:aw D: 07/22/2013 07:26:10 ET T: 07/22/2013 07:46:44 ET JOB#: [id number]  cc: Duane Lope. Judithann Sheen, MD, <Dictator> Javares Kaufhold Rodena Medin MD ELECTRONICALLY SIGNED 07/23/2013 7:42

## 2014-12-20 NOTE — Discharge Summary (Signed)
PATIENT NAME:  Casey Barrett, Drey P MR#:  [id number] DATE OF BIRTH:  August 02, 1913  Addendum  DATE OF ADMISSION:  07/22/2013 DATE OF DISCHARGE:  07/25/2013   ADMITTING PHYSICIAN: Danelle Earthlyobin T. Eckel, MD  DISCHARGING PHYSICIAN: Murlean HarkShalini Ramasunder, MD  Addendum is being done because of small change to the blood pressure medications at the time of discharge. For the entire discharge summary, please look at the discharge summary dictated by Dedra Skeensodd Mundy on 07/25/2013.   DISCHARGE MEDICATIONS: Include:  1. Aspirin 81 mg p.o. daily.  2. Levothyroxine 25 mcg p.o. daily.  3. PreserVision 1 capsule daily.  4. Zocor 10 mg p.o. at bedtime.  5. Calcium with vitamin D 600 mg/200 international units 2 tablets daily.  6. Nasal spray 25 mcg inhalation twice a day.  7. Symbicort 80/4.5 2 puffs b.i.d.  8. Loratadine 10 mg p.o. daily.  9. Dicyclomine 20 mg p.o. with lunch as needed for abdominal cramping.  10. Donepezil 5 mg p.o. at bedtime.  11. Tylenol 500 mg q.4 hours p.r.n. for pain or fever.  12. Norco 5/325 mg 1 tablet q.4 hours p.r.n. for pain.  13. Vancomycin 1 gram IV q.36 hours.  14. Rocephin 1 gram IV q.24 hours.  15. Hydralazine 25 mg p.o. t.i.d.   The only changes that were made are the Pradaxa is being stopped because not sure what the reason is it was started by PCP. The patient has a pacemaker, not in atrial fibrillation currently, and because of his septic arthritis, his procedure and risk of falls, we will stop the Pradaxa until he follows with Dr. Juel BurrowMasoud as an outpatient. Chlorthalidone has been stopped because it will be too harsh on his kidneys, especially in elderly male with stage III to IV CKD. So, it was changed over to hydralazine 25 mg p.o. 3 times a day at this time.   ADDITIONAL TIME SPENT: 30 minutes.   ____________________________ Enid Baasadhika Ranelle Auker, MD rk:lb D: 07/25/2013 11:01:13 ET T: 07/25/2013 11:49:27 ET JOB#: [id number]  cc: Enid Baasadhika Darshan Solanki, MD, <Dictator> Corky DownsJaved Masoud,  MD Enid BaasADHIKA Dencil Cayson MD ELECTRONICALLY SIGNED 07/27/2013 11:34

## 2014-12-20 NOTE — Consult Note (Signed)
Brief Consult Note: Diagnosis: Resp Failure/Bordline troponins/SSS.   Patient was seen by consultant.   Consult note dictated.   Recommend further assessment or treatment.   Orders entered.   Discussed with Attending MD.   Comments: IMP Resp Failure Mild CHF? Elevated troponins CRI SSS Septic knee Hypothyroid . PLAN ROMI Probably demand ischemia Short term anticoug Agree with ECHO Inhalers Renal input Mild diuresis Doubt true MI Medical therapy for now.  Electronic Signatures: Dorothyann Pengallwood, Jameela Michna D (MD)  (Signed 31-Dec-14 06:48)  Authored: Brief Consult Note   Last Updated: 31-Dec-14 06:48 by Alwyn Peaallwood, Klohe Lovering D (MD)

## 2014-12-20 NOTE — Discharge Summary (Signed)
PATIENT NAME:  Casey Barrett, [name] MR#:  [id number] DATE OF BIRTH:  06/18/1913  DATE OF ADMISSION:  07/22/2013 DATE OF DISCHARGE:  07/25/2013  ADDENDUM   This discharge summary addendum is from infectious disease regarding his antibiotics. He will have an increased dose of ceftriaxone to 2 grams IV q.24 hours. He will continue with vancomycin at current dose and have a vancomycin trough level checked on Friday, 07/27/2013. This will be faxed to my office. Goal vancomycin trough is 15 to 20 and pharmacy can adjust it based on levels.   ____________________________ Stann Mainlandavid P. Sampson GoonFitzgerald, MD dpf:sg D: 07/25/2013 14:15:28 ET T: 07/25/2013 14:48:34 ET JOB#: [id number]  cc: Stann Mainlandavid P. Sampson GoonFitzgerald, MD, <Dictator> Corinthian Mizrahi Sampson GoonFITZGERALD MD ELECTRONICALLY SIGNED 07/31/2013 13:01

## 2014-12-20 NOTE — Consult Note (Signed)
Chief Complaint:  Subjective/Chief Complaint Pt with hx of SSS s/p pacer, senile dementia, COPD, and Stage III CKD now with septic knee requiring surgery. Pt denies CP or SOB.   Brief Assessment:  GEN no acute distress   Cardiac Regular   Respiratory clear BS   Gastrointestinal details normal Soft  Nontender  Bowel sounds normal   EXTR negative edema   Lab Results: Routine Chem:  23-Nov-14 02:25   Glucose, Serum  100  BUN  48  Creatinine (comp)  2.17  Sodium, Serum 142  Potassium, Serum 3.8  Chloride, Serum  109  CO2, Serum 27  eGFR (Non-African American)  24 (eGFR values <55mL/min/1.73 m2 may be an indication of chronic kidney disease (CKD). Calculated eGFR is useful in patients with stable renal function. The eGFR calculation will not be reliable in acutely ill patients when serum creatinine is changing rapidly. It is not useful in  patients on dialysis. The eGFR calculation may not be applicable to patients at the low and high extremes of body sizes, pregnant women, and vegetarians.)  Routine Hem:  23-Nov-14 02:25   WBC (CBC)  11.8  Hemoglobin (CBC)  12.4  Platelet Count (CBC) 229   Radiology Results: [place]:    23-Nov-14 04:39, [place] Color Flow Doppler Lower Extrem Right (Leg)  [place] Color Flow Doppler Lower Extrem Right (Leg)   REASON FOR EXAM:    Swelling  COMMENTS:       PROCEDURE: [place]  - US DOPPLER LOW EXTR RIGHT  - Jul 22 2013  4:39AM     CLINICAL DATA:  Leg Swelling.    EXAM:  RIGHT LOWER EXTREMITY VENOUS ULTRASOUND    TECHNIQUE:  Gray-scale sonography with graded compression, as well as color  Doppler and duplex ultrasound, were performed to evaluate the deep  venous system from the level of the common femoral vein through the  popliteal and proximal calf veins (excluding peroneals on this  exam). Spectral Doppler was utilized to evaluate flow at rest and  with distal augmentation maneuvers.    COMPARISON:  02/25/2012.    FINDINGS:  Thrombus within  deep veins:  None visualized.    Compressibility of deep veins:  Normal.    Duplex waveform respiratory phasicity: Normal.    Duplex waveform response to augmentation:  Normal.    Venous reflux:  None visualized.  Other findings: 6 x 3 x 2 cm popliteal fossa mass which is  predominantly nonvascular, although there is increased flow within  the wall. There is heterogeneous echogenicity material within the  popliteal fossa of collection. Baker cyst in the contralateral  popliteal fossa appeared simple on previous sonography.     IMPRESSION:  1. Negative for right lower extremity deep venous thrombosis.  2. Presumed 6 cm Baker cyst. There are inflammatory changes in the  cyst wall, and internal debris.      Electronically Signed    By: Jorje Guild M.D.    On: 07/22/2013 04:52     Verified By: Gilford Silvius, M.D.,   Assessment/Plan:  Assessment/Plan:  Plan Pt medically stable and cleared for surgery. High risk due to age and cardiac hx. Will add low dose imdur and follow with you.   Electronic Signatures: Idelle Crouch (MD)  (Signed [phone] 07:17)  Authored: Chief Complaint, Brief Assessment, Lab Results, Radiology Results, Assessment/Plan   Last Updated: 23-Nov-14 07:17 by Idelle Crouch (MD)

## 2014-12-20 NOTE — Consult Note (Signed)
Brief Consult Note: Diagnosis: Septic arthritis R knee - cx neg, s/p washout.   Patient was seen by consultant.   Consult note dictated.   Recommend further assessment or treatment.   Orders entered.   Discussed with Attending MD.   Comments: Rec cont vanco with goal trough 15-20 and ceftriaxone (increase dose to 2 gm daily) for 2 weeks total from time of washout Monitor cbc cmet and esr crp weekly I can see next week. After 2 weeks parenteral therapy would likely continue 2 more weeks of empiric abx (likely with cipro and doxy (listed as allergic to tetracycline but pt saws was GI intolerance) thank you for consult.  Electronic Signatures: Angelena Form (MD)  (Signed [phone] 12:45)  Authored: Brief Consult Note   Last Updated: 26-Nov-14 12:45 by Angelena Form (MD)

## 2014-12-20 NOTE — H&P (Signed)
PATIENT NAME:  Casey Barrett, Casey Barrett MR#:  [id number] DATE OF BIRTH:  18-Mar-1913  DATE OF ADMISSION:  11/27/2012  PRIMARY CARE PHYSICIAN: Corky Downs, MD  CHIEF COMPLAINT: Fall/weakness.   HISTORY OF PRESENT ILLNESS: This is a 81 year old who presents from home after a suspected fall/presyncopal episode. The patient says that he woke up to go to the bathroom when his legs gave way and he fell and hit the back of his head but did not lose consciousness. He then had trouble getting off the floor of the bathroom and he had to call [facility] to come assist him.  There was no obvious trauma noted on his head and he did not have any further injuries. The patient after EMS arrived was still feeling increasingly weak, unable to get up on his feet, and therefore was brought to the ER for further evaluation. In the Emergency Room, the patient was noted to be slightly hypertensive and also noted to have a mildly elevated troponin. Hospitalist services were contacted for further treatment and evaluation. The patient denies any chest pain, denies any palpitations, denies any loss of consciousness or true syncope, and denies any fevers or  chills. He does admit to having a cough and upper respiratory symptoms now for the past few days. He denies any nausea, vomiting, diarrhea, or abdominal pain, or any other associated symptoms presently.   REVIEW OF SYSTEMS:   CONSTITUTIONAL: No documented fever. No weight gain. No weight loss.  EYES: No blurry or double vision.  ENT: No tinnitus. No postnasal drip. No redness of the oropharynx.  RESPIRATORY: Positive cough. No wheeze. No hemoptysis. No dyspnea. No COPD.  CARDIOVASCULAR: No chest pain. No orthopnea. No palpitations. No syncope.  GASTROINTESTINAL: No nausea, no vomiting, no diarrhea, no abdominal pain and no melena or hematochezia.  GENITOURINARY: No dysuria or hematuria.  ENDOCRINE: No polyuria or nocturia. No heat or cold intolerance. HEMATOLOGIC: No anemia, no  bruising and no bleeding.  INTEGUMENTARY: No rashes and no lesions.  MUSCULOSKELETAL: No arthritis, no swelling and no gout.  NEUROLOGIC: No numbness, no tingling, no ataxia, and no seizure-type activity.  PSYCHIATRIC: No anxiety, no insomnia and no ADD.   PAST MEDICAL HISTORY: Is consistent with hypertension, hypothyroidism, hyperlipidemia and history of sick sinus syndrome status post pacemaker placement.   ALLERGIES: PENICILLIN, SULFA AND TETRACYCLINE. The reaction is unknown.   SOCIAL HISTORY: Used to be a smoker many years ago, quit 20 to 30 plus years ago. No alcohol abuse. No illicit drug abuse. Lives at home by himself.   FAMILY HISTORY: Mother had Parkinson's. Father died of unknown cause.   CURRENT MEDICATIONS:  1.  Aspirin 81 mg daily. 2.  Calcium with vitamin D 2 tabs daily. 3.  Hydrochlorothiazide/lisinopril 12.5/10 mg 1 tab daily. 4.  Synthroid 25 mcg daily. 5.  PreserVision 1 tab daily. 6.  Simvastatin 10 mg at bedtime.   PHYSICAL EXAMINATION: VITAL SIGNS:  Temperature is 98.4, pulse 56, respirations 23, blood pressure 179/89 and sats 96% on 2 liters nasal cannula. GENERAL: He is a pleasant appearing male in no apparent distress.  HEENT: Atraumatic, normocephalic. Extraocular muscles are intact. Pupils equal and reactive to light. Sclerae anicteric. No conjunctival injection. No pharyngeal erythema.  NECK: Supple. No jugular venous distention, no bruits, no lymphadenopathy and no thyromegaly.  HEART: Regular rate and rhythm. No murmurs, no rubs and no clicks.  LUNGS: Clear to auscultation bilaterally. No rales or rhonchi. No wheezes.  ABDOMEN: Soft, flat, nontender and nondistended. Has good  bowel sounds. No hepatosplenomegaly appreciated.  EXTREMITIES: No evidence of any cyanosis, clubbing or peripheral edema. Has +2 pedal and radial pulses bilaterally.  NEUROLOGIC: The patient is alert, awake and oriented x 3 with no focal motor or sensory deficits appreciated  bilaterally.  SKIN: Moist and warm with no rashes appreciated.  LYMPHATIC: There is no cervical or axillary lymphadenopathy.   LABORATORY AND DIAGNOSTICS:  Serum glucose 99, BUN 29, creatinine 1.8, sodium 143, potassium 3.5, chloride 108 and bicarbonate 28. The patient's LFTs are within normal limits. Troponin is 0.06. White cell count is 11.1, hemoglobin 13.4, hematocrit 41 and platelet count 151. Urinalysis is within normal limits.   The patient did have a chest x-ray done which shows cardiomegaly, COPD and a pacemaker, but no other acute process. The pt also had a CT of the head done without contrast showing no evidence of any acute intracranial abnormalities.   ASSESSMENT AND PLAN: This is a 81 year old male with history of sick sinus syndrome status post pacemaker, hypertension, hypothyroidism and hyperlipidemia who presents to the hospital with suspected fall/presyncope and also noted to have generalized weakness. The patient also incidentally is noted to have an elevated troponin.  1.  Presyncope/fall. The exact etiology of this is unclear, but probably related to deconditioning. The patient has a nonfocal neurological exam. He has a CT of the head which is negative. Orthostatics have not been checked, which I will order. I will also get a physical therapy evaluation to assess his mobility.  2.  Elevated troponin. Likely related to demand ischemia. No evidence of any chest pain or acute coronary syndrome. He is currently asymptomatic. His EKG shows a paced rhythm. I will keep him overnight on off unit telemetry, check serial troponins, and continue his aspirin and statin.  3.  Hypertension. Continue lisinopril. Continue hydrochlorothiazide.  4.  Hypothyroidism. Continue Synthroid.  5.  Hyperlipidemia. Continue simvastatin.  6.  Acute bronchitis. I will place him on a short prednisone taper and on some p.o. Zithromax.   CODE STATUS: The patient is a FULL CODE.  TIME SPENT ON WITH THE  ADMISSION: 45 minutes.  ____________________________ Rolly PancakeVivek J. Cherlynn KaiserSainani, MD vjs:sb D: 11/27/2012 14:14:29 ET T: 11/27/2012 14:24:06 ET JOB#: [id number]  cc: Rolly PancakeVivek J. Cherlynn KaiserSainani, MD, <Dictator> Houston SirenVIVEK J SAINANI MD ELECTRONICALLY SIGNED 11/29/2012 21:17

## 2014-12-20 NOTE — Op Note (Signed)
PATIENT NAME:  Casey Barrett, Azzam P MR#:  [id number] DATE OF BIRTH:  Dec 31, 1912  DATE OF PROCEDURE:  07/22/2013  PREOPERATIVE DIAGNOSIS: Right septic knee arthritis.   POSTOPERATIVE DIAGNOSIS: Right septic knee arthritis.  PROCEDURE PERFORMED: Arthroscopic irrigation and debridement of his right knee.   SURGEON OF RECORD: Thornton Papasobin Eckel, MD.  ANESTHESIA:  General.   TOTAL FLUIDS: 350 of crystalloid.   ESTIMATED BLOOD LOSS: 25 mL  PREOPERATIVE ANTIBIOTICS: None.   COMPLICATIONS: None.   DISPOSITION: Stable to PACU upon transport.   INDICATIONS FOR PROCEDURE: This 81 year old male with five days of increasing pain and swelling about his right knee. He presented to the Emergency Room early this morning, where knee aspirate demonstrated a cell count approximately 50,000 white cells with 98% neutrophils. Given his increased pain, effusion and inability to bear weight and decrease range of motion and fluid analysis, he consented for arthroscopic I and D for presumed septic arthritis. Risks and benefits of the procedure, were discussed with the risks to include but limited to bleeding, infection, damage to nerves and/or vessels, need for additional surgical procedures. He agreed to proceed with the operation after weighing these risks.   DESCRIPTION OF PROCEDURE: The patient was brought to the Operating Room and placed on the operating room table with all extremities appropriately padded. The right lower extremity was cleansed with alcohol and then prepped and draped in sterile fashion. A timeout was then performed for anesthesia, nursing staff and surgeon of record, to confirm surgical site, patient, medical record number, date of birth, laterality and procedures to be performed. We next flexed the bed down and created anterolateral portal in standard fashion feeling a soft spot just above the tibial plateau and just lateral to the patellar tendon. The incision was made with 11 blade followed by the  scope trocar. Next, the scope was placed into the suprapatellar pouch and a superolateral outflow portal was created under direct arthroscopic visualization. There was diffuse synovitis noted within the knee and diagnostic arthroscopy demonstrated what appeared to be grade 4 chondral changes on both the undersurface of the patella as well as the medial and lateral joint spaces. Anteromedial portal was created under direct arthroscopic visualization and arthroscopic shaver was used to debride back excess synovitic tissue. At this point, we then irrigated 6 liters of saline through the knee joint. All portal incisions were closed with nylon suture and the patient was placed in a well-padded dressing and a knee immobilizer for soft tissue rest. He was transferred to PACU in stable condition. He will await final culture results to tailor his antibiotic therapy.    ____________________________ Danelle Earthlyobin T. Eckel, MD tte:cc D: 07/22/2013 14:27:28 ET T: 07/23/2013 01:29:51 ET JOB#: [id number]  cc: Danelle Earthlyobin T. Eckel, MD, <Dictator> Danelle EarthlyBIN T ECKEL MD ELECTRONICALLY SIGNED 07/23/2013 17:47

## 2014-12-20 NOTE — Discharge Summary (Signed)
PATIENT NAME:  Casey Barrett, Casey Barrett MR#:  [id number] DATE OF BIRTH:  [birth year]  DATE OF ADMISSION:  07/22/2013 DATE OF DISCHARGE:  07/25/2013   ADMITTING DIAGNOSIS: Right septic knee with arthritis.   DISCHARGE DIAGNOSIS: Right septic knee with arthritis.   OPERATION: The patient, on 07/22/2013, was brought to the operating room, where he had an arthroscopic irrigation and debridement of the right knee.   SURGEON: Danelle Earthlyobin T. Eckel, MD   ESTIMATED BLOOD LOSS: 25 mL.   ANTIBIOTICS: No antibiotics were given preoperatively.   COMPLICATIONS: None.   The patient was brought to the recovery room and brought down to the orthopedic floor.   HISTORY: The patient is a 81 year old male who presented for significantly worsening knee pain over 4 days. The patient was unable to bend it or put weight on it. The patient was complaining of warmth and was having fevers.   PHYSICAL EXAMINATION:  GENERAL: Well-developed, well-nourished male in no apparent distress.  RESPIRATIONS: Clear.  CARDIAC: Regular rate and rhythm.  MUSCULOSKELETAL: In regard to the right knee, the patient has positive effusion, warmth to touch, with limited motion to about 50 degrees. The patient has pain with motion.   The patient had an aspiration revealing 50,000 white blood cells, 94% PMNs. The patient was then brought to surgery that same day.   HOSPITAL COURSE: After initial admission on 07/22/2013, the patient was brought to the orthopedic floor after surgery. The patient did some physical therapy, bed to chair and progressed, with slight improvement. Still getting bed to chair, but allowing motion up to about 90 degrees. The patient did have a PICC line placed on 07/24/2013 for IV antibiotics. The patient is to receive 4 weeks of IV Rocephin and vancomycin. We had consulted infectious disease with Dr. Clydie Braunavid Fitzgerald for evaluation before discharge.   CONDITION AT DISCHARGE: Stable.   DISPOSITION: The patient was sent to  rehab.   DISCHARGE INSTRUCTIONS:  1. The patient will follow up in about a week for wound care.  2. The patient is to do weight-bear as tolerated.  3. The patient has no brace on the leg and will allow range of motion as tolerated.  4. The patient will elevate the leg for swelling and use thigh-high TED hose on both legs, removed at bedtime.  5. The patient is encouraged to do cough and deep breathing.  6. Diet is regular.  7. Wound care. The patient will use an ice pack for swelling and keep the dressing clean and dry and try not to get it wet. The patient will have dressing change as needed.  8. The rehab center will call the clinic if there is any bright red bleeding, calf pain, bowel or bladder difficulty or fever greater than 101.5.  9. The patient will do physical therapy and occupational therapy per protocol. 10. The patient will have PICC line care.  11. Will have a CBC with differential weekly, a BMP weekly and vancomycin peak and trough weekly.   DISCHARGE MEDICATIONS:  1. Aspirin 81 mg once a day.  2. Synthroid 25 mcg 1 tablet daily.  3. PreserVision 2 Antioxidants 1 capsule daily. 4. Zocor 10 mg daily. 5. Calcium 600 with vitamin D 2 tablets daily. 6. Flunisolide 25 mcg daily. 7. Symbicort 80 mcg 4.5 inhalation daily. 8. Loratadine 10 mg Barrett.r.n.  9. Dicyclomine 20 mg at lunch.  10. Chlorthalidone 25 mg daily. 11. Pradaxa 75 mg daily. 12. Donepezil 5 mg 1 tablet daily. 13. Tylenol 500 mg  1 tablet q.4 hours. 14. Vicodin 5/325 mg 1 tablet q.4 hours.  15. Vancomycin 1000 mg intravenously every 36 hours. 16. Rocephin 1 gram intravenous every 24 hours.  17. Hydralazine 25 mg 1 tablet b.i.d. 18. Normal saline line flush 5 mL daily and line flush 5 mL Barrett.r.n. 19. Heparin line flush 10 units as needed for line patency and line flush heparin 50 units 5 mg injection once a day.    ____________________________ J. Dedra Skeens, [place] jtm:lb D: 07/25/2013 07:48:59 ET T: 07/25/2013  07:58:10 ET JOB#: [id number]  cc: J. Dedra Skeens, [place], <Dictator> J Militza Devery [facility] ELECTRONICALLY SIGNED 08/05/2013 6:43

## 2014-12-21 NOTE — Discharge Summary (Signed)
PATIENT NAME:  Casey Barrett, Casey Barrett MR#:  [id number] DATE OF BIRTH:  [birth year]  DATE OF ADMISSION:  08/28/2013 DATE OF DISCHARGE:  09/06/2013  Please refer to the interim summary dictated 09/06/2013. I did not see the patient. The patient was discharged by the palliative care service on 09/06/2013. Again, please refer to the interim summary dictated by Dr. Auburn BilberryShreyang Patel.   ____________________________ [name]. Juliene PinaMody, MD spm:np D: 09/06/2013 14:29:00 ET T: 09/06/2013 20:23:59 ET JOB#: [id number]  cc: [name]. Juliene PinaMody, MD, <Dictator> [name] MD ELECTRONICALLY SIGNED 09/18/2013 16:44

## 2014-12-21 NOTE — Consult Note (Signed)
PATIENT NAME:  Casey Barrett, HARRIOTT MR#:  [id number] DATE OF BIRTH:  [birth year]  CARDIOLOGY CONSULTATION DATE OF CONSULTATION:  08/28/2013  REFERRING PHYSICIAN:  Dr. Sherryll Burger. CONSULTING PHYSICIAN:  Lattie Riege D. Juliann Pares, MD PRIMARY CARE PHYSICIAN: Dr. Juel Burrow.  INDICATION: Shortness of breath.   HISTORY OF PRESENT ILLNESS: The patient is a 81 year old male with known history of hypertension, thyroid disease, admitted with septic joint recently, now being readmitted for  acute-on-chronic hypoxic respiratory failure. The patient has elevated troponins as well. The patient states that he was recently in the hospital in November for a septic joint. Had IV antibiotics and went home with a PICC line. The patient is doing fine and finished antibiotic course.  He had a PICC line in place for a month, and it was  removed recently. The patient complains of recent shortness of breath, found to be hypoxic with sats in the 90s.  He was placed on 2 liters, but his sats kept dropping. He was placed on 6 L  and brought to the Emergency Room for evaluation. The patient states the PICC line was removed and still had   pieces in his arm.  He has had some swelling, he was short of breath, using accessory muscles. He has elevated creatinine. Baseline, his BNP was 33,000 and also had elevated troponin 0.35 denied any chest pain, but was subsequently admitted for further evaluation and care.   PAST MEDICAL HISTORY: Hypertension, hypothyroidism, hyperlipidemia, sick sinus syndrome, septic knee.  ALLERGIES: PENICILLIN, SULFA, TETRACYCLINE.   SOCIAL HISTORY: Former smoker. No alcohol consumption. Lives at [facility].  FAMILY HISTORY: Parkinson disease.   MEDICATIONS:  Tylenol with hydrocodone p.r.n., DuoNeb p.r.n., aspirin 81 mg a day, calcium carbonate once a day, Keflex tablet,  Chlorthalidone 25 mg p.o. mg a day, clonidine 0.1 twice a day, Plavix 75 a day. dicyclomine 20 mg at lunchtime, donepezil daily, flunisolide 1 puff  daily, Lasix 20 a day. hydralazine 25 twice daily,  levothyroxine 25 mcg a day, loratadine 10 mg a day, MAPAP 500 mg daily, Symbicort twice a day, tramadol 250 mg every 4 hours p.r.n.    REVIEW OF SYSTEMS: No blackout spells or syncope. No significant nausea or vomiting. No fever, no chills, no sweats. No weight loss or weight gain. No hemoptysis, hematemesis. No bright red blood per rectum. He has had shortness of breath, dyspnea, congestion, decreased hearing, slight swelling of the thumb from placement of PICC line. No chest pain, otherwise negative.   PHYSICAL EXAMINATION: VITAL SIGNS: Blood pressure elevated at 180/70, pulse 55, respiratory rate 16, afebrile.  HEENT: Normocephalic, atraumatic. Pupils equal, reactive to light. NECK: Supple. No significant JVD, bruits or lymphadenopathy.  LUNGS: Bilateral rhonchi. No actual wheezing, no rales.  HEART: Regular rate and rhythm, slightly bradycardic. Systolic ejection murmur at the apex.  ABDOMEN: Benign.  EXTREMITIES: Within normal limits.  NEUROLOGICAL: Intact. SKIN: Normal.   LABORATORIES: White count was 11. Hemoglobin 11.4, hematocrit 36, platelet count 160. Troponin 0.25. LFTs normal. Alkaline phosphatase is 206, BUN 71, creatinine is 2.92, chloride 109. BNP 33,000.   Chest x-ray showed mild pulmonary vascular congestion and mild bilateral pulmonary infiltrates, possible pneumonia.   EKG, paced rhythm, nonspecific findings.    IMPRESSION:   1.  Acute-on-chronic respiratory failure.  2.  Hypoxemia.  3.  Congestive heart failure.  4.  Chronic renal insufficiency.  5.  Possible non-Q-wave myocardial infarction.  6.  Sick sinus syndrome.  7.  Chronic obstructive pulmonary disease.  8.  Hypothyroidism.  PLAN:  1. Continue current care. Continue to rule out for myocardial infarction. Followup cardiac enzymes. Followup EKG. The patient is paced, therefore, EKG is not as helpful. Telemetry. Intensive Care Unit care I think is useful  for his hypoxemia. Echocardiogram may be helpful. Short-term anticoagulation for now. I do not recommend cardiac catheterization at this stage.  2. For respiratory failure, continue oxygen therapy, inhalers and consider steroid therapy. He will need broad-spectrum antibiotics for possible pneumonia and respiratory support.  3. For sick sinus syndrome, continue pacemaker followup. Appears to be functioning well, not sure that interrogation is necessary.  4. Hypothyroidism, continue levothyroxine. 4. Renal insufficiency, continue to followup BUN and creatinine. No direct therapy at this point. This may be contributing to his elevated troponin, however. Again, troponins appear to be demand ischemia, in my opinion, and does not represent acute cardiac event.  5. For heart failure, we will continue mild diuresis, oxygen therapy as well as get an echocardiogram and continue current care. We will treat the patient conservatively for now unless symptoms warrant further evaluation.  ____________________________ Bobbie Stackwayne D. Juliann Paresallwood, MD ddc:sg D: 08/29/2013 11:11:00 ET T: 08/29/2013 12:56:15 ET JOB#: [id number]  cc: Jamesetta Greenhalgh D. Juliann Paresallwood, MD, <Dictator> Alwyn PeaWAYNE D Deylan Canterbury MD ELECTRONICALLY SIGNED 10/09/2013 10:00

## 2014-12-21 NOTE — H&P (Signed)
PATIENT NAME:  Casey Barrett, Casey Barrett MR#:  [id number] DATE OF BIRTH:  1913/05/20  DATE OF ADMISSION:  08/28/2013  DICTATING HOSPITALIST: Dontee Jaso S. Sherryll Burger, MD.  PRIMARY CARE PHYSICIAN: Dr. Juel Burrow.   REQUESTING PHYSICIAN: Governor Rooks, MD.   CHIEF COMPLAINT: Shortness of breath.   HISTORY OF PRESENT ILLNESS: The patient is a 81 year old male with a known history of hypertension, hypothyroidism with recent admission for septic knee joint who is being admitted for acute-on-chronic hypoxic respiratory failure, likely multifactorial in etiology. Certainly could have non-ST elevation MI.   The patient was here in the hospital from the 23rd of November until the 26th of November, when he was discharged to [facility] for IV antibiotics for his right septic knee joint with PICC line.   The patient was doing okay and had finished his course of antibiotics for about a month, and the PICC line was removed yesterday; after which, this morning, he was noticed to have worsening shortness of breath and was found to be hypoxic in low 90s. Was placed on 2 liters oxygen via nasal cannula but oxygen saturations keep dropping and 6 liters oxygen was applied by EMS and was brought into the Emergency Department.   As per his daughter and per facility staff, he had a PICC line removed and had a piece still in his arm. Since yesterday, his arm has been swelling a lot more, and while in the ED, he was found to be very short of breath, using accessory muscles of respiration and has labored breathing. He was gasping when I saw him and was noted to have a troponin of 0.25 with BNP 33,209. His creatinine was bumped up to 2.92, and he is being admitted for further evaluation and management.   PAST MEDICAL HISTORY:  1.  Hypertension.  2.  Hypothyroidism.  3.  Hyperlipidemia.  4.  History of sick sinus syndrome, status post pacemaker placement.  5.  Right knee septic arthritis for which he was recently admitted and discharged on  the 26th of November 2014.   ALLERGIES: PENICILLIN, SULFA AND TETRACYCLINE.   SOCIAL HISTORY: He is a former smoker. No alcohol. No illicit drug use. He was at [facility] for IV antibiotics. Normally he lives at [facility] nursing home.   FAMILY HISTORY: Mother had Parkinson disease. Father died of unknown cause.   MEDICATIONS AT HOME:  1.  Acetaminophen/hydrocodone 325/5 mg 1 tablet p.o. every 4 hours as needed.  2.  DuoNebs 3 mL inhaled every 6 hours as needed.  3.   Aspirin 81 mg p.o. daily. 4.  Calcium carbonate with vitamin D 1 tablet p.o. daily. 5.  Cephalexin 500 mg p.o. 3 times a day for 14 days for right knee infection as per MAR. 6.  Chlorthalidone 25 mg p.o. daily.  7.  Clonidine 0.1 mg p.o. b.i.d.  8.  Plavix 75 mg p.o. daily.  9.  Dicyclomine 20 mg p.o. daily at lunch time.  10.  Donepezil 5 mg p.o. daily.  11.  Flunisolide 1 puff inhaled daily.  12.  Lasix 20 mg p.o. daily.  13.  Hydralazine 25 mg p.o. b.i.d.  14.  Levothyroxine 25 mcg p.o. daily.  15.  Loratadine 10 mg p.o. daily as needed.  16.  MAPAP 500 mg p.o. every 4 hours.  17.  PreserVision 1 capsule p.o. daily.  18.  Symbicort 2 puffs inhaled twice a day.  19.  Tramadol 50 mg p.o. every 4 hours as needed.   REVIEW OF  SYSTEMS: CONSTITUTIONAL: No fever. Positive fatigue and weakness.  EYES: No blurred or double vision.  ENT: No tinnitus or ear pain. Decreased hearing.  RESPIRATORY: No cough, wheezing or hemoptysis.  LUNGS: No cough, wheezing or hemoptysis. Positive for shortness of breath.  CARDIOVASCULAR: No chest pain, orthopnea. He does have edema in his left upper arm, likely at the site of the PICC insertion.  GASTROINTESTINAL: No nausea, vomiting, diarrhea.  GENITOURINARY: No dysuria or hematuria.  ENDOCRINE: No polyuria, nocturia.   HEMATOLOGY: No anemia, easy bruising.  SKIN: Left upper extremity edema and swelling.  MUSCULOSKELETAL: History of right septic knee for which he was on IV  antibiotics.  NEUROLOGIC: Some speech disturbances as per his daughter, although the patient reports that he has very dry mouth and his dentures were not in.  PSYCHIATRY: No history of anxiety or depression.   PHYSICAL EXAMINATION:  VITAL SIGNS: Temperature 97.6, heart rate 55 per minute, respirations 21 per minute, blood pressure 180/69 mmHg. He is saturating 99% on 6 liters oxygen via nasal cannula.  GENERAL: A 81 year old male lying in the bed looking critically sick and in acute respiratory distress.  EYES: Pupils equal, round, reactive to light and accommodation. No scleral icterus. Extraocular muscles intact.  HEENT: Head atraumatic, normocephalic. Oropharynx and nasopharynx clear.  NECK: Supple. No jugular venous distention. No thyroid enlargement or tenderness.  LUNGS: Decreased breath sounds at the bases bilaterally, rhonchi throughout both lungs. No wheezing. Using accessory muscles of respiration. Does have some labored breathing also.  CARDIOVASCULAR: S1, S2 normal. No murmurs, rubs or gallops.  ABDOMEN: Soft, nontender, nondistended. Bowel sounds present. No organomegaly or mass.  EXTREMITIES: No pedal edema, cyanosis or clubbing except his left upper extremity which has swelling up to his elbow. It is nonpitting in nature. No cyanosis or clubbing.  NEUROLOGIC: Cranial nerves II through XII intact. Muscle strength four out of five in all extremities. Sensation seems intact.  SKIN: No obvious rash, lesion or ulcer. He has some bandages from his old previous IV site.    PSYCHIATRY: Seems alert and he knew that he is at [employer]. He could tell me his date of birth and was able to identify his daughter also who is at the bedside.   LABORATORY PANEL: CBC showed white count of 11.0, hemoglobin 11.4, hematocrit 35.7, platelets 160, CK of 68, MB fraction 4.7, troponin of 0.25. LFTs within normal limits except alkaline phosphatase of 206. BMP within normal limits except a BUN of 71,  creatinine of 2.92, and chloride of 109. BNP was 33,209.   Chest x-ray in the ED showed cardiomegaly with pulmonary vascular congestion. Bilateral basilar pulmonary infiltrate with right-sided small pleural effusion. Bilateral basilar pneumonia possible.   EKG shows a paced rhythm. No major ST-T changes.   IMPRESSION AND PLAN:  1.  Acute-on-chronic hypoxic respiratory failure, likely multifactorial with congestive heart failure, poor nutrition.   Certainly could have underlying infection and/or chronic obstructive pulmonary disease with possible non-ST elevation myocardial infarction.  We will admit him to critical care unit stepdown unit, start him on aggressive diuresis, consult cardiology. Discussed the case with Dr. Juliann Pares who will see the patient. Certainly considering his advanced age, he remains at very high risk for decompensation also, and we will consult palliative care at this time also. Will keep him on oxygen and use BiPAP as needed. HE IS DO NOT RESUSCITATE AS PER DAUGHTER.  2.  Congestive heart failure. Will obtain 2-D echocardiogram. Consult cardiology. Obtain daily weights. Strict  ins and outs. His family doctor is Dr. Juel BurrowMasoud, and he denies having any regular cardiologist at this time other than following with Dr. Juel BurrowMasoud for most of his cardiology needs. I do not see any echocardiogram here in [facility], so we will go ahead and order one. Will continue him on intravenous diuresis and monitor him closely.  3.  Acute-on-chronic kidney disease, stage III with a baseline serum creatinine of around 1.6 to 1.9. This would be prerenal. We will monitor his kidney function while on diuretics as this could get worse. Consider nephrology consult if continues to get worse. 4.  Possible non-ST elevation myocardial infarction with troponin of 0.25. This certainly could be supply/demand ischemia with congestive heart failure and respiratory failure. Will monitor in  stepdown unit and consult cardiology.  Start him on aspirin. Will hold off further anticoagulation. Will get serial troponins, beta blocker as blood pressure tolerates.  5.  History of sick sinus syndrome, status post pacer. His pacer seems to be working. He does have paced rhythm on EKG.  6.  Chronic obstructive pulmonary disease/asthma. Does not seem to be wheezing. We will hold off steroids and continue him on nebulizer breathing treatment.  7.  Hypothyroid. We will continue Synthroid. Check TSH.  8.  CODE STATUS: DNR.  We will consult palliative care for further evaluation and long-term goals.   CRITICAL CARE TOTAL TIME TAKING CARE OF THIS PATIENT: 55 minutes.    ____________________________ Ellamae SiaVipul S. Sherryll BurgerShah, MD vss:np D: 08/28/2013 14:59:47 ET T: 08/28/2013 16:24:16 ET JOB#: [id number]  cc: Biagio Snelson S. Sherryll BurgerShah, MD, <Dictator> Corky DownsJaved Masoud, MD  Ellamae SiaVIPUL S [facility] MD ELECTRONICALLY SIGNED 09/04/2013 10:55

## 2015-05-31 IMAGING — CT CT HEAD WITHOUT CONTRAST
1 series · 16 of 30 positions shown, 20 images · non-contrast
Comparison: CT head 07/07/2009

CLINICAL DATA: Memory loss. Confusion and dementia.

EXAM:
CT HEAD WITHOUT CONTRAST
TECHNIQUE: Contiguous axial images were obtained from the base of the skull
through the vertex without intravenous contrast.

[Series 2: soft tissue · axial · 0.43mm/px · z∈[+1228,+1363]mm · 16 of 31 slices shown, 20 images]
[im 2/31  brain]
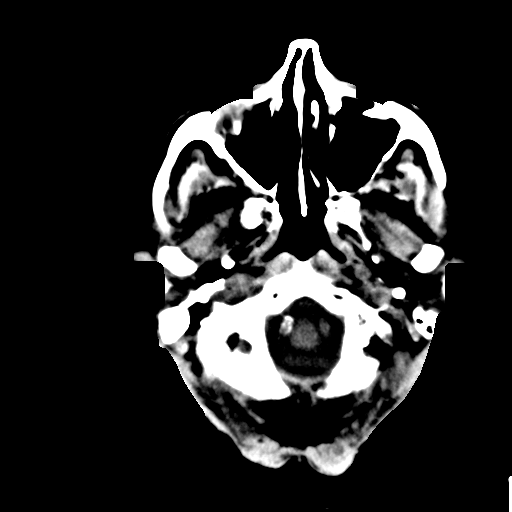
[im 2/31  bone]
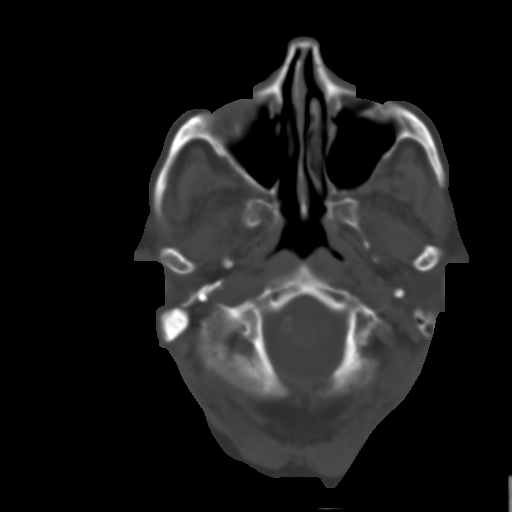
[im 4/31  brain]
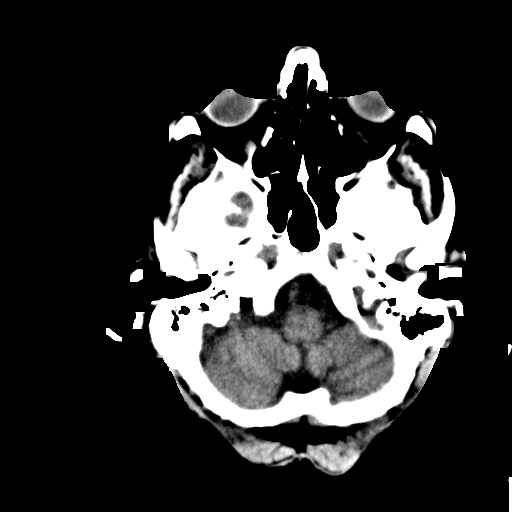
[im 6/31  brain]
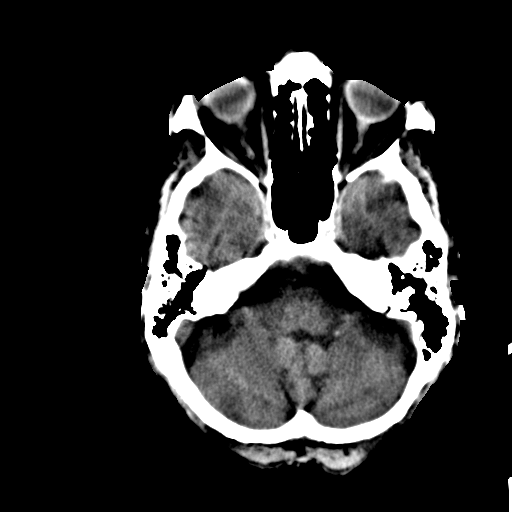
[im 8/31  brain]
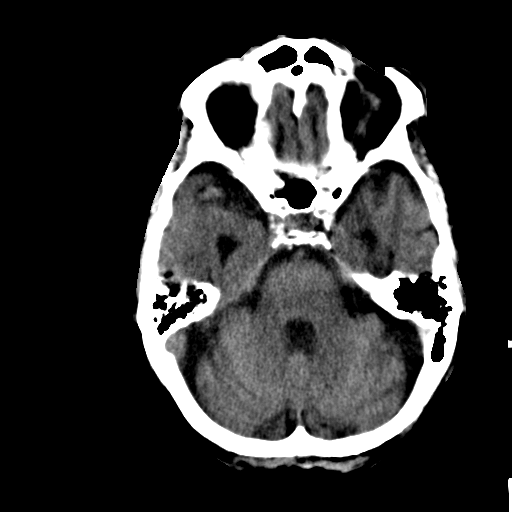
[im 9/31  brain]
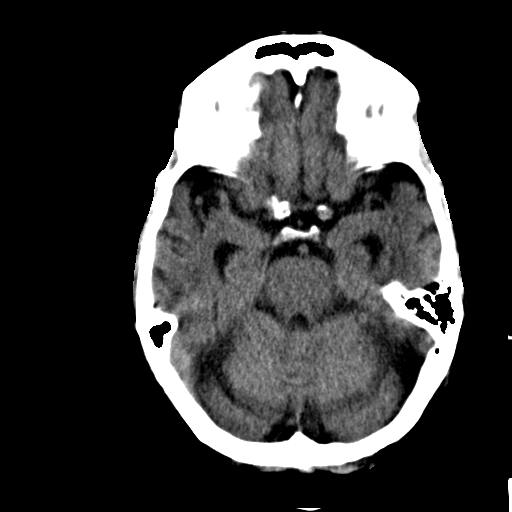
[im 9/31  bone]
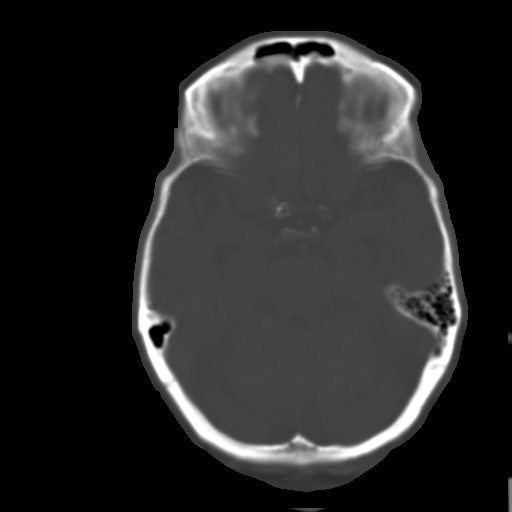
[im 11/31  brain]
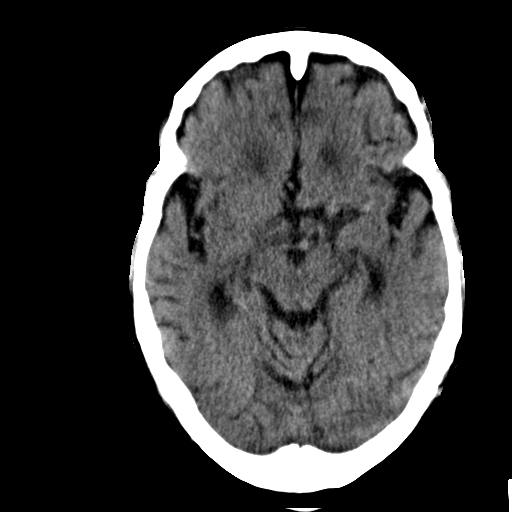
[im 13/31  brain]
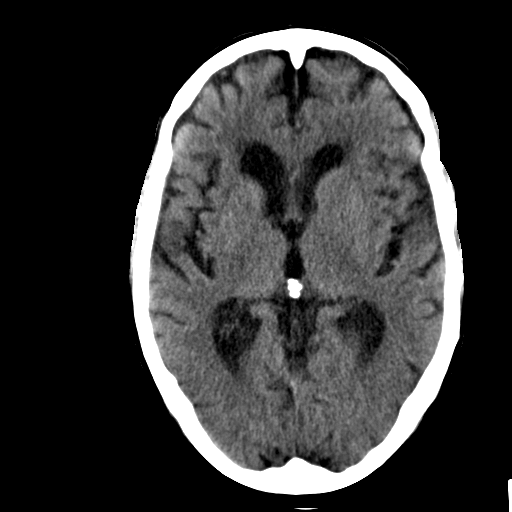
[im 15/31  brain]
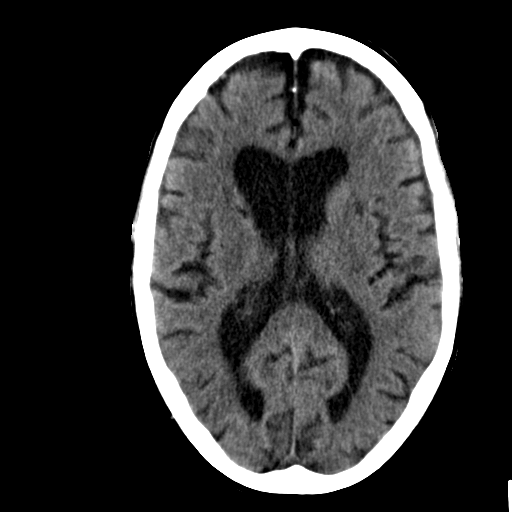
[im 16/31  brain]
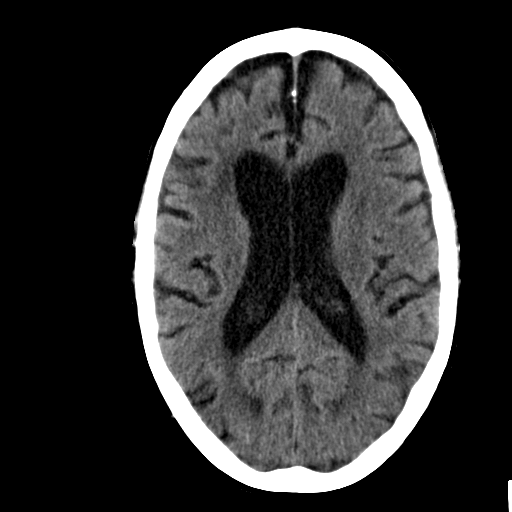
[im 16/31  bone]
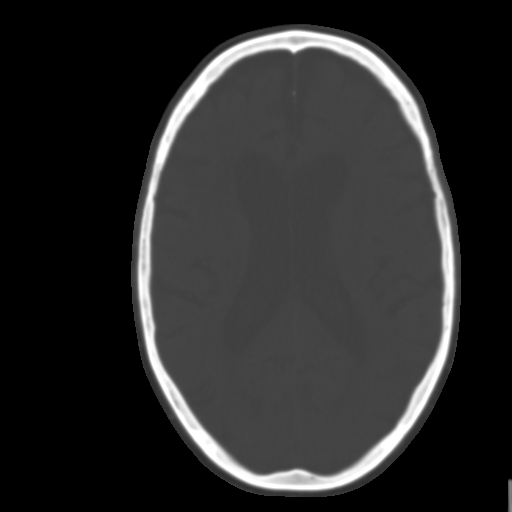
[im 18/31  brain]
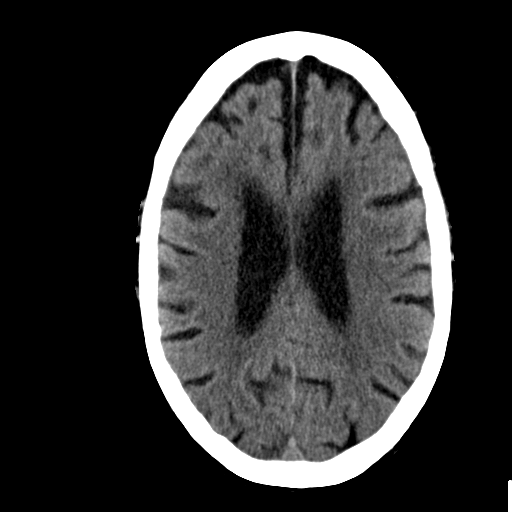
[im 20/31  brain]
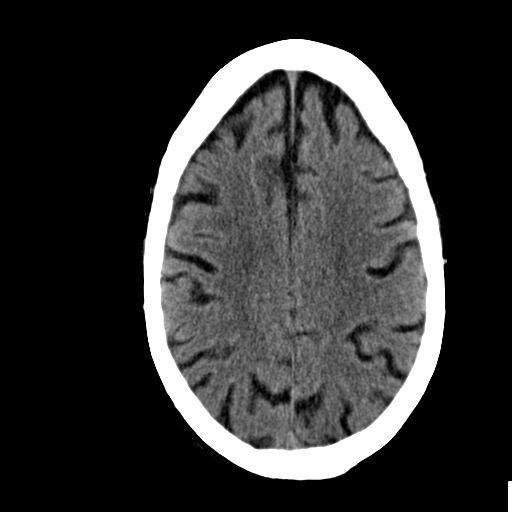
[im 22/31  brain]
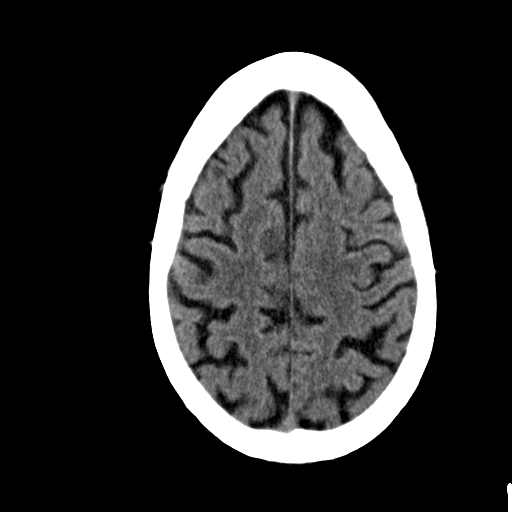
[im 23/31  brain]
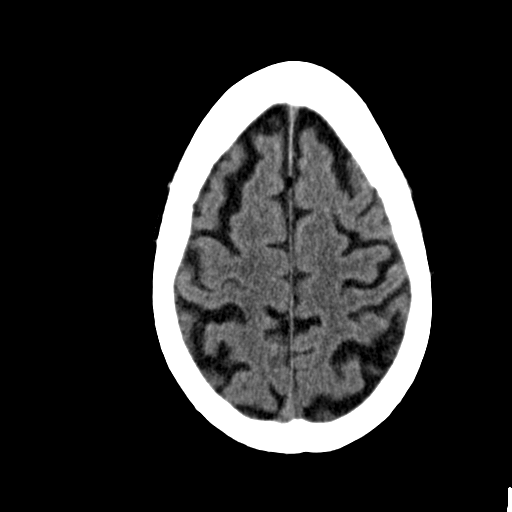
[im 23/31  bone]
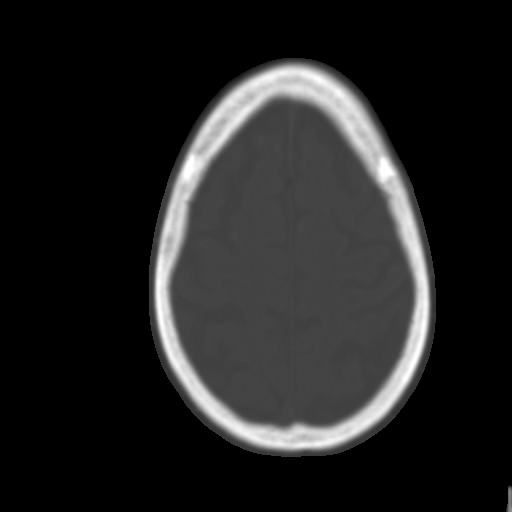
[im 25/31  brain]
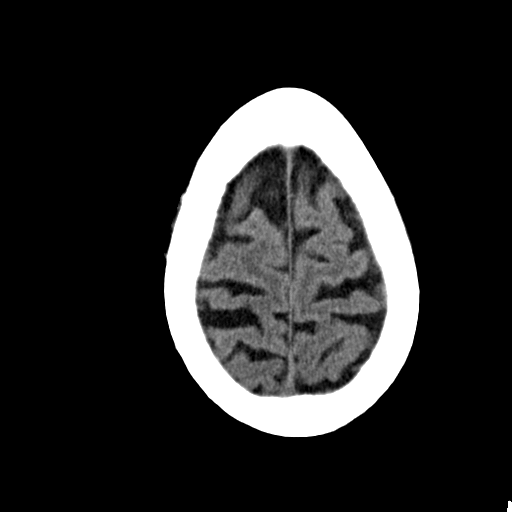
[im 27/31  brain]
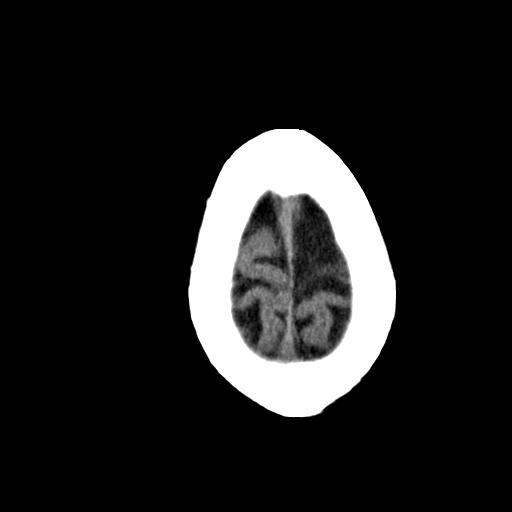
[im 29/31  brain]
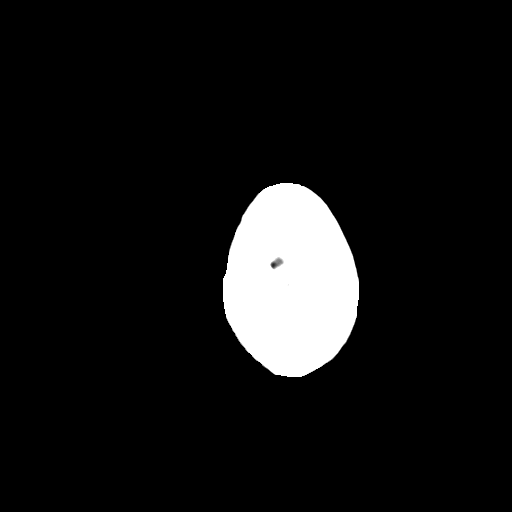

[16 of 30 positions shown; findings below may reference images not displayed]

FINDINGS: Generalized atrophy with mild progression. Chronic microvascular
ischemic change in the white matter also unchanged.

Negative for acute infarct. Negative for hemorrhage or mass. No
edema or midline shift. Calvarium is intact.
IMPRESSION: Mild progression of atrophy.

Chronic microvascular ischemic changes are stable. No acute
abnormality.
